# Patient Record
Sex: Female | Born: 1950 | Race: White | Hispanic: No | Marital: Married | Smoking: Former smoker
Health system: Southern US, Community
[De-identification: ages and names within clinical notes are randomized; demographics above are authoritative.]

## PROBLEM LIST (undated history)

## (undated) DIAGNOSIS — M109 Gout, unspecified: Secondary | ICD-10-CM

## (undated) DIAGNOSIS — F32A Depression, unspecified: Secondary | ICD-10-CM

## (undated) DIAGNOSIS — I10 Essential (primary) hypertension: Secondary | ICD-10-CM

## (undated) DIAGNOSIS — F419 Anxiety disorder, unspecified: Secondary | ICD-10-CM

## (undated) DIAGNOSIS — M1990 Unspecified osteoarthritis, unspecified site: Secondary | ICD-10-CM

## (undated) DIAGNOSIS — G4733 Obstructive sleep apnea (adult) (pediatric): Secondary | ICD-10-CM

## (undated) DIAGNOSIS — F329 Major depressive disorder, single episode, unspecified: Secondary | ICD-10-CM

## (undated) DIAGNOSIS — E785 Hyperlipidemia, unspecified: Secondary | ICD-10-CM

## (undated) HISTORY — DX: Essential (primary) hypertension: I10

## (undated) HISTORY — PX: CARPAL TUNNEL RELEASE: SHX101

## (undated) HISTORY — DX: Depression, unspecified: F32.A

## (undated) HISTORY — PX: PLANTAR FASCIA RELEASE: SHX2239

## (undated) HISTORY — PX: ABDOMINAL HYSTERECTOMY: SHX81

## (undated) HISTORY — PX: CHOLECYSTECTOMY: SHX55

## (undated) HISTORY — DX: Unspecified osteoarthritis, unspecified site: M19.90

## (undated) HISTORY — PX: REPLACEMENT TOTAL KNEE: SUR1224

## (undated) HISTORY — DX: Major depressive disorder, single episode, unspecified: F32.9

## (undated) HISTORY — DX: Hyperlipidemia, unspecified: E78.5

## (undated) HISTORY — DX: Obstructive sleep apnea (adult) (pediatric): G47.33

## (undated) HISTORY — DX: Anxiety disorder, unspecified: F41.9

## (undated) HISTORY — PX: GASTRIC BYPASS: SHX52

---

## 1999-04-06 ENCOUNTER — Encounter: Admission: RE | Admit: 1999-04-06 | Discharge: 1999-07-05 | Payer: Self-pay | Admitting: Unknown Physician Specialty

## 1999-05-09 ENCOUNTER — Emergency Department (HOSPITAL_COMMUNITY): Admission: EM | Admit: 1999-05-09 | Discharge: 1999-05-09 | Payer: Self-pay | Admitting: Emergency Medicine

## 1999-05-10 ENCOUNTER — Inpatient Hospital Stay (HOSPITAL_COMMUNITY): Admission: EM | Admit: 1999-05-10 | Discharge: 1999-05-13 | Payer: Self-pay | Admitting: *Deleted

## 1999-05-10 ENCOUNTER — Encounter (INDEPENDENT_AMBULATORY_CARE_PROVIDER_SITE_OTHER): Payer: Self-pay

## 1999-05-10 ENCOUNTER — Encounter: Payer: Self-pay | Admitting: Emergency Medicine

## 2003-04-05 ENCOUNTER — Encounter: Admission: RE | Admit: 2003-04-05 | Discharge: 2003-07-04 | Payer: Self-pay | Admitting: Surgery

## 2003-04-09 ENCOUNTER — Encounter: Admission: RE | Admit: 2003-04-09 | Discharge: 2003-04-09 | Payer: Self-pay | Admitting: Surgery

## 2003-04-10 ENCOUNTER — Encounter: Admission: RE | Admit: 2003-04-10 | Discharge: 2003-07-09 | Payer: Self-pay | Admitting: Surgery

## 2003-04-16 ENCOUNTER — Encounter (INDEPENDENT_AMBULATORY_CARE_PROVIDER_SITE_OTHER): Payer: Self-pay | Admitting: Cardiology

## 2003-04-16 ENCOUNTER — Ambulatory Visit (HOSPITAL_COMMUNITY): Admission: RE | Admit: 2003-04-16 | Discharge: 2003-04-16 | Payer: Self-pay | Admitting: Surgery

## 2003-08-20 ENCOUNTER — Encounter: Admission: RE | Admit: 2003-08-20 | Discharge: 2003-11-18 | Payer: Self-pay | Admitting: Surgery

## 2003-10-28 ENCOUNTER — Encounter (INDEPENDENT_AMBULATORY_CARE_PROVIDER_SITE_OTHER): Payer: Self-pay | Admitting: Specialist

## 2003-10-28 ENCOUNTER — Inpatient Hospital Stay (HOSPITAL_COMMUNITY): Admission: RE | Admit: 2003-10-28 | Discharge: 2003-10-31 | Payer: Self-pay | Admitting: Surgery

## 2004-01-07 ENCOUNTER — Encounter: Admission: RE | Admit: 2004-01-07 | Discharge: 2004-04-06 | Payer: Self-pay | Admitting: Surgery

## 2004-04-14 ENCOUNTER — Encounter: Admission: RE | Admit: 2004-04-14 | Discharge: 2004-04-14 | Payer: Self-pay | Admitting: Surgery

## 2004-07-27 ENCOUNTER — Encounter: Admission: RE | Admit: 2004-07-27 | Discharge: 2004-10-25 | Payer: Self-pay | Admitting: Surgery

## 2005-12-22 ENCOUNTER — Ambulatory Visit: Payer: Self-pay | Admitting: Cardiology

## 2005-12-28 ENCOUNTER — Ambulatory Visit: Payer: Self-pay

## 2005-12-28 ENCOUNTER — Encounter: Payer: Self-pay | Admitting: Cardiology

## 2009-10-27 ENCOUNTER — Inpatient Hospital Stay (HOSPITAL_COMMUNITY): Admission: RE | Admit: 2009-10-27 | Discharge: 2009-10-30 | Payer: Self-pay | Admitting: Orthopedic Surgery

## 2010-02-18 ENCOUNTER — Ambulatory Visit (HOSPITAL_COMMUNITY): Admission: RE | Admit: 2010-02-18 | Discharge: 2010-02-18 | Payer: Self-pay | Admitting: Orthopedic Surgery

## 2010-08-20 LAB — PROTIME-INR: Prothrombin Time: 13 seconds (ref 11.6–15.2)

## 2010-08-20 LAB — COMPREHENSIVE METABOLIC PANEL
ALT: 15 U/L (ref 0–35)
AST: 18 U/L (ref 0–37)
Albumin: 3.6 g/dL (ref 3.5–5.2)
BUN: 15 mg/dL (ref 6–23)
CO2: 27 mEq/L (ref 19–32)
Calcium: 8.6 mg/dL (ref 8.4–10.5)
Glucose, Bld: 67 mg/dL — ABNORMAL LOW (ref 70–99)

## 2010-08-20 LAB — URINALYSIS, ROUTINE W REFLEX MICROSCOPIC
Glucose, UA: NEGATIVE mg/dL
Ketones, ur: NEGATIVE mg/dL
Nitrite: NEGATIVE
Specific Gravity, Urine: 1.03 (ref 1.005–1.030)
pH: 5 (ref 5.0–8.0)

## 2010-08-20 LAB — CBC
HCT: 32.1 % — ABNORMAL LOW (ref 36.0–46.0)
Hemoglobin: 10.8 g/dL — ABNORMAL LOW (ref 12.0–15.0)
MCH: 28.1 pg (ref 26.0–34.0)
MCHC: 33.7 g/dL (ref 30.0–36.0)
MCV: 83.5 fL (ref 78.0–100.0)
Platelets: 299 10*3/uL (ref 150–400)
RBC: 3.84 MIL/uL — ABNORMAL LOW (ref 3.87–5.11)
WBC: 7.6 10*3/uL (ref 4.0–10.5)

## 2010-08-20 LAB — URINE MICROSCOPIC-ADD ON

## 2010-08-20 LAB — SURGICAL PCR SCREEN: MRSA, PCR: NEGATIVE

## 2010-08-24 LAB — COMPREHENSIVE METABOLIC PANEL
AST: 17 U/L (ref 0–37)
Albumin: 3.6 g/dL (ref 3.5–5.2)
Alkaline Phosphatase: 86 U/L (ref 39–117)
Chloride: 105 mEq/L (ref 96–112)
Glucose, Bld: 94 mg/dL (ref 70–99)
Potassium: 4.8 mEq/L (ref 3.5–5.1)
Sodium: 142 mEq/L (ref 135–145)
Total Bilirubin: 0.5 mg/dL (ref 0.3–1.2)

## 2010-08-24 LAB — BASIC METABOLIC PANEL
BUN: 8 mg/dL (ref 6–23)
CO2: 29 mEq/L (ref 19–32)
Calcium: 8.3 mg/dL — ABNORMAL LOW (ref 8.4–10.5)
Calcium: 8.5 mg/dL (ref 8.4–10.5)
Creatinine, Ser: 0.68 mg/dL (ref 0.4–1.2)
GFR calc Af Amer: >60 mL/min (ref >60)
GFR calc Af Amer: >60 mL/min (ref >60)
GFR calc Af Amer: >60 mL/min (ref >60)
GFR calc non Af Amer: >60 mL/min (ref >60)
Glucose, Bld: 135 mg/dL — ABNORMAL HIGH (ref 70–99)
Glucose, Bld: 144 mg/dL — ABNORMAL HIGH (ref 70–99)
Potassium: 4 mEq/L (ref 3.5–5.1)
Sodium: 136 mEq/L (ref 135–145)
Sodium: 137 mEq/L (ref 135–145)
Sodium: 138 mEq/L (ref 135–145)

## 2010-08-24 LAB — CBC
HCT: 37.9 % (ref 36.0–46.0)
Hemoglobin: 10.7 g/dL — ABNORMAL LOW (ref 12.0–15.0)
MCHC: 33.2 g/dL (ref 30.0–36.0)
MCHC: 34 g/dL (ref 30.0–36.0)
MCHC: 34.2 g/dL (ref 30.0–36.0)
MCV: 88 fL (ref 78.0–100.0)
MCV: 88.4 fL (ref 78.0–100.0)
MCV: 88.5 fL (ref 78.0–100.0)
Platelets: 244 10*3/uL (ref 150–400)
Platelets: 263 10*3/uL (ref 150–400)
RBC: 3.59 MIL/uL — ABNORMAL LOW (ref 3.87–5.11)
RDW: 13.3 % (ref 11.5–15.5)
RDW: 13.5 % (ref 11.5–15.5)
WBC: 11.5 10*3/uL — ABNORMAL HIGH (ref 4.0–10.5)
WBC: 9.7 10*3/uL (ref 4.0–10.5)

## 2010-08-24 LAB — PROTIME-INR
INR: 0.97 (ref 0.00–1.49)
INR: 1.79 — ABNORMAL HIGH (ref 0.00–1.49)

## 2010-08-24 LAB — URINALYSIS, ROUTINE W REFLEX MICROSCOPIC
Hgb urine dipstick: NEGATIVE
Ketones, ur: NEGATIVE mg/dL
Protein, ur: NEGATIVE mg/dL
Specific Gravity, Urine: 1.025 (ref 1.005–1.030)
pH: 6 (ref 5.0–8.0)

## 2010-10-23 NOTE — Assessment & Plan Note (Signed)
[facility]                              CARDIOLOGY OFFICE NOTE   Kathleen Saunders, Kathleen Saunders                        MRN:          [id number]  DATE:12/22/2005                            DOB:          06/04/1951    Kathleen Saunders is a 60 year old female with a past medical history of gastric  bypass surgery, who I am asked to evaluate for palpitations.  She has no  prior cardiac history.  She denies any dyspnea on exertion, orthopnea, PND,  pedal edema, palpitations, presyncope, syncope, or exertional chest pain.  Approximately one week ago the patient states that she woke early in the  morning at approximately 4:00 a.m.  She went to the bathroom and upon her  return to bed, she developed the sudden onset of palpitations.  These were  described as her heart racing.  There was no associated shortness of breath,  nausea, vomiting, chest pain, palpitations, or syncope.  It resolved after  approximately 15 minutes.  She has had no symptoms since.  She was seen by  Dr. Charm Barges and we were asked to further evaluate.  There is a question of  atrial fibrillation on electrocardiogram as well.   MEDICATIONS:  None at this time.   ALLERGIES:  SHE HAS NO KNOWN DRUG ALLERGIES.   PAST MEDICAL HISTORY:  There is no diabetes mellitus, hypertension, or  hyperlipidemia by her report.  She does have a history of obesity and is  status post gastric bypass surgery and has lost approximately 135 pounds.  She also has a history of sleep apnea but has not used CPAP since her  surgery.  There is a history of asthma as well as arthritis.   SOCIAL HISTORY:  She does not smoke.  She also does not consume alcohol.   FAMILY HISTORY:  Strongly positive for coronary disease as her father died  of a myocardial infarction at age 29.  Her mother has also had coronary  disease.  She has a sister who had a stroke at an early age.   REVIEW OF SYSTEMS:  There are no headaches, fevers, or chills.   There is no  productive cough or hemoptysis.  There is no dysphagia, odynophagia, melena,  or hematochezia.  There is no dysuria or hematuria.  There is no rash or  seizure activity.  There is no orthopnea, PND, or pedal edema.  Remainder of  systems are negative.   PHYSICAL EXAMINATION:  VITALS:  Today shows a blood pressure of 128/75 and  her pulse is 57.  She weighs 214 pounds.  GENERAL:  She is well-developed and somewhat obese.  She is in no acute  distress.  SKIN:  Warm and dry.  She does not appear to be depressed.  There is no peripheral clubbing.  HEENT:  Unremarkable with no __________.  NECK:  Supple with a normal S1, S2 bilaterally.  There are no bruits noted.  There is no jugular venous distention.  I cannot appreciate thyromegaly.  CHEST:  Clear to auscultation with normal expansion.  CARDIOVASCULAR:  Regular rate and rhythm.  Normal S1 and S2.  There are no  murmurs, rubs, or gallops noted.  ABDOMINAL EXAM:  Nontender.  Positive bowel sounds.  No hepatosplenomegaly.  No masses appreciated.  There is no abdominal bruit.  She is status post  cholecystectomy.  She has 2+ femoral pulses bilaterally.  No bruits.  EXTREMITIES:  Show no edema.  I can palpate no cords.  She has 2+ dorsalis  pedis pulses bilaterally.  NEUROLOGICAL  EXAM:  Grossly intact.  I do have an electrocardiogram from Dr. Silvana Newness office that appears to show  a sinus rhythm with poor R wave progression and PACs.  It is a technically  suboptimal electrocardiogram.  Her electrocardiogram here shows a sinus  rhythm at a rate of 54.  There are no ST changes noted.  Note, there is  sinus arrhythmia.   DIAGNOSES:  1.  Palpitations.  2.  Strong family history of coronary disease.  3.  Status post gastric bypass surgery.   PLAN:  Kathleen Saunders presents for evaluation of episode of palpitations that  sounds potentially like it could be SVT.  This is her first episode and she  has had no subsequent episodes.  We  will therefore follow this expectantly.  If she has recurrent episodes in the future, then we will provide her with  an event monitor for documentation of arrhythmia.  She does have a strong  family history of coronary disease and I would like to review her LV  function.  We will therefore schedule her for a stress echocardiogram for  risk stratification.  Will also check a TSH.  I will have her return to see  me in approximately four months.                              Madolyn Frieze Jens Som, MD, [facility]    BSC/MedQ  DD:  12/22/2005  DT:  12/22/2005  Job #:  [id number]   cc:   Samuel Jester

## 2010-10-23 NOTE — Discharge Summary (Signed)
NAME:  MINDEE, ROBLEDO                           ACCOUNT NO.:  [id number]   MEDICAL RECORD NO.:  [id number]                   PATIENT TYPE:  INP   LOCATION:  0455                                 FACILITY:  [facility]   PHYSICIAN:  Thornton Park. Daphine Deutscher, M.D.             DATE OF BIRTH:  11-17-1950   DATE OF ADMISSION:  10/28/2003  DATE OF DISCHARGE:  10/31/2003                                 DISCHARGE SUMMARY   ADMISSION DIAGNOSIS:  Morbid obesity.   DISCHARGE DIAGNOSIS:  Morbid obesity.   PROCEDURE:  Laparoscopic Roux-en-Y gastric bypass.   COURSE IN THE HOSPITAL:  Riyanshi Wahab is a 60 year old lady admitted in the  a.m. of May 23rd and underwent a laparoscopic Roux-en-Y gastric bypass.  Postop day #1 she has a Doppler study of her lower extremity which did not  show any evidence of DVT.  She went down and had a swallow which showed a  good pouch and prompt emptying without evidence of a leak.  She was advanced  on a liquid diet.  She did have some numbness in her right hand primarily  the first fingers and thumb but it seemed like it was getting better.  She  was tolerating the diet well on Oct 31, 2003 and was ready for discharge.  She was given Roxicet elixir to take as needed for pain and she will be  followed up in the office within the week for drain removal.  She was  instructed on the management of her drain which she felt comfortable with.   CONDITION:  Good.  Return in 1 week.                                               Thornton Park Daphine Deutscher, M.D.    MBM/MEDQ  D:  10/31/2003  T:  10/31/2003  Job:  [id number]

## 2010-10-23 NOTE — Op Note (Signed)
NAME:  Kathleen Saunders, Kathleen Saunders                           ACCOUNT NO.:  [id number]   MEDICAL RECORD NO.:  [id number]                   PATIENT TYPE:  INP   LOCATION:  X001                                 FACILITY:  [facility]   PHYSICIAN:  Thornton Park. Daphine Deutscher, M.D.             DATE OF BIRTH:  [birth year]   DATE OF PROCEDURE:  10/28/2003  DATE OF DISCHARGE:                                 OPERATIVE REPORT   PREOPERATIVE DIAGNOSES:  Morbid obesity, BMI 53.   POSTOPERATIVE DIAGNOSES:  Morbid obesity, BMI 53.   PROCEDURE:  Laparoscopic roux-en-Y gastric bypass (100 cm roux limb, 44 cm  biliopancreatic limb).   SURGEON:  Thornton Park. Daphine Deutscher, M.D.   ASSISTANT:  Sandria Bales. Ezzard Standing, M.D.   ANESTHESIA:  General endotracheal.   OPERATIVE TIME:  3 hours and 30 minutes.   DRAINS:  One JP drain in the left upper quadrant.   DESCRIPTION OF PROCEDURE:  Kathleen Saunders is  a 60 year old lady taken to  room one. Preoperative extensive informed consent was obtained regarding  laparoscopic as well as open gastric bypass. Following introduction of  anesthesia, the abdomen was prepped widely with Betadine and draped  sterilely. Using the zero degree scope in the Optiview technique, we were  able to gain access to the left upper quadrant without difficulty and  insufflate the abdomen. I then placed a 5 mm lateral to that and with the  harmonic scalpel took down mini adhesions in the midline which were mainly  omentum stuck up to the anterior abdominal wall. This was necessary to  enable [place] to move forward with the operation and I did this completely. I  also took down adhesions in her previous Kocher subcostal incision where she  had an open gallbladder. Following this, I placed the remainder of my  trocars including two slightly to the right of midline, one below the  umbilicus and with these we first identified the ligament of Treitz and I  marked about 44 cm which is where I found maximal freedom of this loop  going  up toward the liver.  I divided it with two applications of the endoGIA and  then used the harmonic to make a little bit deep cut into the mesentery. I  then sutured a tail of Penrose drain on the distal tip and then measured  down 100 cm.   At that point, I sutured the end of the biliopancreatic limb to the side of  the roux limb, tied these together then.  I made a common opening, inserted  the stapler and fired the stapler.  There was a little area where the suture  had pulled through and I subsequently reinforced that with a simple suture  of 3-0 silk. In the meantime the common defect was closed from either end  using 2-0 Vicryl starting in the staple line brought out to each end and  then suturing toward  each other where I then tied across from each other and  closed this completely. This was subsequently sealed with Tisseel.   The mesenteric defect was then closed using a laparotie on one end of the 2-  0 silk using the endostitch and suturing the mesenteric defect closed with  the device completely.   Next, the [facility] retractor was inserted, the liver was retracted. We went  up to the top and I went up and took down the cardia and found that it was  really stuck to the spleen. I was however, able to create a small window  there. I then measured down 4 cm on the lesser curvature and we created a  window there along the lesser curvature and went back behind the stomach to  create that space.  Through that window I then introduced the stapler and  fired that.  We then used the Ewald tube to help calibrate the inlet and  then used multiple applications of the GIA heading straight up trying to  make a long skin tube about 4 cm long. We were able to get into the empty  space and were able to create the pouch with several applications ending up  at the previous dissection site.   The roux limb was then brought up and it seemed to come up easily antecolic,  antegastric. It  was sutured to the end of the staple line with a running 2-0  Vicryl tying it at the end and then suturing along this bringing the roux  limb alongside the staple line of the small gastric pouch. When this was  completed, I put a laparotie on the end.  Openings were made in both the  stomach and the small intestine and through this the endoGIA was passed and  subsequently fired. The staple line was inspected and looked good. The  common defect was closed with two sutures again using the 2-0 Vicryl. The  closed this completely. A second layer was completely anteriorly using a  running free suture of 2-0 Vicryl suturing this and imbricating it over  between the small intestine and the stomach. This was done over the Ewald  tube which had been passed through the anastomosis.   This was then passed across the anastomosis and removed and then I clamped  the bowel distally and Dr. Ezzard Standing endoscoped the patient which revealed a  nice small pouch, no evidence of bleeding, a wide open anastomosis.  No  bubbles were seen from the emersion of the pouch.  I then went through the  irrigant. Tisseel was applied on the gastrojejunostomy.  It had previously  been also applied along the staple margin of the proximal pouch on both the  remnant and on the pouch.  A JP drain was placed beneath the liver and the  Nathanson retractor was removed. This drain was sutured in place with 3-0  nylon.  The ports were withdrawn, abdomen deflated. Prior to doing this, we  surveyed the abdomen and no other problems or issues were present.  The  wounds were then closed with 4-0 Vicryl subcuticularly. The patient  tolerated the procedure well. She was taken to the recovery room in  satisfactory condition.                                               Thornton Park Daphine Deutscher,  M.D.    MBM/MEDQ  D:  10/28/2003  T:  10/28/2003  Job:  [id number]   cc:   [address]  [place]  [place] [address]  Fax: [phone]   Carlisle Beers. Rendall III, M.D.  [address] [place]  [place] [address]  Fax: [phone]

## 2010-10-23 NOTE — Op Note (Signed)
NAME:  ADEN, YOUNGMAN                           ACCOUNT NO.:  [id number]   MEDICAL RECORD NO.:  [id number]                   PATIENT TYPE:  INP   LOCATION:  0455                                 FACILITY:  [facility]   PHYSICIAN:  Sandria Bales. Ezzard Standing, M.D.               DATE OF BIRTH:  Dec 16, 1950   DATE OF PROCEDURE:  10/28/2003  DATE OF DISCHARGE:                                 OPERATIVE REPORT   PREOPERATIVE DIAGNOSIS:  Morbid obesity with body mass index of 53, status  post Roux-Y gastrojejunostomy.   POSTOPERATIVE DIAGNOSIS:  Morbid obesity with body mass index of 53, status  post Roux-Y gastrojejunostomy.   PROCEDURE:  Esophagogastroduodenoscopy.   SURGEON:  Dr. Ezzard Standing, MD   ANESTHESIA:  General.   INDICATION FOR PROCEDURE:  Ms. Shoults is a morbidly obese female with  approximately 53 BMI, who has undergone a laparoscopic Roux-en-Y  gastrojejunostomy by Dr. Wenda Low.  I am now doing an endoscopy to  document patency of the anastomosis and locations of the anastomosis.   DESCRIPTION OF PROCEDURE:  The patient is under general anesthesia.  Laparoscoping the patient with laparoscopy, now to do an upper endoscopy.   The flexible Olympus endoscope was passed without difficulty in the  esophagus, into the stomach pouch.  The gastrojejunal anastomosis was widely  patent, and I took pictures of this.  The gastrojejunal anastomosis was at  47 cm.  Her esophagogastric junction was at about 41-42 cm for a pouch of  about 5-6 cm in length.  I then insufflated the pouch while Dr. Daphine Deutscher held  a clamp on the jejunum, and he flooded the upper abdomen with saline.  There  was no evidence of air leak.  There was no evidence of bleeding from the  pouch.   The esophagus was considered normal.  The patient tolerated the procedure  well.  Dr. Daphine Deutscher will dictate the remainder of the Roux-Y  gastrojejunostomy.                                               Sandria Bales. Ezzard Standing, M.D.    DHN/MEDQ  D:   10/29/2003  T:  10/29/2003  Job:  [id number]

## 2015-06-17 DIAGNOSIS — M5136 Other intervertebral disc degeneration, lumbar region: Secondary | ICD-10-CM | POA: Diagnosis not present

## 2015-06-17 DIAGNOSIS — M179 Osteoarthritis of knee, unspecified: Secondary | ICD-10-CM | POA: Diagnosis not present

## 2015-06-17 DIAGNOSIS — I10 Essential (primary) hypertension: Secondary | ICD-10-CM | POA: Diagnosis not present

## 2015-06-17 DIAGNOSIS — G4733 Obstructive sleep apnea (adult) (pediatric): Secondary | ICD-10-CM | POA: Diagnosis not present

## 2016-06-02 ENCOUNTER — Ambulatory Visit (INDEPENDENT_AMBULATORY_CARE_PROVIDER_SITE_OTHER): Payer: PPO | Admitting: Family

## 2016-06-02 ENCOUNTER — Encounter (INDEPENDENT_AMBULATORY_CARE_PROVIDER_SITE_OTHER): Payer: Self-pay

## 2016-06-02 ENCOUNTER — Encounter: Payer: Self-pay | Admitting: Family

## 2016-06-02 VITALS — BP 110/66 | HR 65 | Temp 97.1°F | Ht 63.0 in | Wt 265.8 lb

## 2016-06-02 DIAGNOSIS — Z6841 Body Mass Index (BMI) 40.0 and over, adult: Secondary | ICD-10-CM

## 2016-06-02 DIAGNOSIS — I10 Essential (primary) hypertension: Secondary | ICD-10-CM

## 2016-06-02 DIAGNOSIS — Z1211 Encounter for screening for malignant neoplasm of colon: Secondary | ICD-10-CM | POA: Diagnosis not present

## 2016-06-02 DIAGNOSIS — F329 Major depressive disorder, single episode, unspecified: Secondary | ICD-10-CM | POA: Insufficient documentation

## 2016-06-02 DIAGNOSIS — M159 Polyosteoarthritis, unspecified: Secondary | ICD-10-CM

## 2016-06-02 DIAGNOSIS — F331 Major depressive disorder, recurrent, moderate: Secondary | ICD-10-CM | POA: Diagnosis not present

## 2016-06-02 DIAGNOSIS — Z1239 Encounter for other screening for malignant neoplasm of breast: Secondary | ICD-10-CM

## 2016-06-02 DIAGNOSIS — M19012 Primary osteoarthritis, left shoulder: Secondary | ICD-10-CM | POA: Diagnosis not present

## 2016-06-02 DIAGNOSIS — F32A Depression, unspecified: Secondary | ICD-10-CM | POA: Insufficient documentation

## 2016-06-02 DIAGNOSIS — F411 Generalized anxiety disorder: Secondary | ICD-10-CM | POA: Diagnosis not present

## 2016-06-02 DIAGNOSIS — Z1159 Encounter for screening for other viral diseases: Secondary | ICD-10-CM

## 2016-06-02 DIAGNOSIS — M19019 Primary osteoarthritis, unspecified shoulder: Secondary | ICD-10-CM

## 2016-06-02 DIAGNOSIS — M150 Primary generalized (osteo)arthritis: Secondary | ICD-10-CM | POA: Diagnosis not present

## 2016-06-02 DIAGNOSIS — J45909 Unspecified asthma, uncomplicated: Secondary | ICD-10-CM | POA: Insufficient documentation

## 2016-06-02 DIAGNOSIS — E785 Hyperlipidemia, unspecified: Secondary | ICD-10-CM

## 2016-06-02 DIAGNOSIS — Z114 Encounter for screening for human immunodeficiency virus [HIV]: Secondary | ICD-10-CM

## 2016-06-02 DIAGNOSIS — Z1231 Encounter for screening mammogram for malignant neoplasm of breast: Secondary | ICD-10-CM | POA: Diagnosis not present

## 2016-06-02 DIAGNOSIS — M1990 Unspecified osteoarthritis, unspecified site: Secondary | ICD-10-CM | POA: Insufficient documentation

## 2016-06-02 DIAGNOSIS — J4520 Mild intermittent asthma, uncomplicated: Secondary | ICD-10-CM | POA: Diagnosis not present

## 2016-06-02 MED ORDER — LISINOPRIL-HYDROCHLOROTHIAZIDE 20-25 MG PO TABS: 1.0000 | ORAL_TABLET | Freq: Every day | ORAL | 3 refills | Status: DC

## 2016-06-02 MED ORDER — BUPIVACAINE HCL 0.25 % IJ SOLN
1.0000 mL | Freq: Once | INTRAMUSCULAR | Status: AC
  Administered 2016-06-02: 1 mL via INTRA_ARTICULAR

## 2016-06-02 MED ORDER — MELOXICAM 15 MG PO TABS: ORAL_TABLET | ORAL | 1 refills | Status: DC

## 2016-06-02 MED ORDER — SERTRALINE HCL 50 MG PO TABS: 50.0000 mg | ORAL_TABLET | Freq: Every day | ORAL | 3 refills | Status: DC

## 2016-06-02 MED ORDER — METHOCARBAMOL 500 MG PO TABS: 500.0000 mg | ORAL_TABLET | Freq: Four times a day (QID) | ORAL | 3 refills | Status: DC | PRN

## 2016-06-02 MED ORDER — METHYLPREDNISOLONE ACETATE 40 MG/ML IJ SUSP
40.0000 mg | Freq: Once | INTRAMUSCULAR | Status: AC
  Administered 2016-06-02: 40 mg via INTRA_ARTICULAR

## 2016-06-02 MED ORDER — ALPRAZOLAM 0.5 MG PO TABS: 0.5000 mg | ORAL_TABLET | Freq: Every evening | ORAL | 3 refills | Status: DC | PRN

## 2016-06-02 NOTE — Progress Notes (Signed)
Subjective:    Patient ID: Kathleen Saunders, female    DOB: 02-Dec-1950, 65 y.o.   MRN: [id number]  Pt presents to the office today to establish care and chronic follow up.  Hypertension  This is a chronic problem. The current episode started more than 1 year ago. The problem has been resolved since onset. The problem is controlled. Associated symptoms include anxiety and shortness of breath (at times). Pertinent negatives include no headaches, malaise/fatigue, palpitations or peripheral edema. Risk factors for coronary artery disease include family history, obesity and sedentary lifestyle. Past treatments include ACE inhibitors. The current treatment provides moderate improvement. There is no history of kidney disease, CAD/MI, CVA, heart failure or a thyroid problem.  Anxiety  Presents for follow-up visit. Symptoms include shortness of breath (at times). Patient reports no depressed mood, dry mouth, excessive worry, nausea, nervous/anxious behavior or palpitations. The quality of sleep is good.   Her past medical history is significant for asthma.  Depression         This is a chronic problem.  The current episode started more than 1 year ago.   The onset quality is gradual.   The problem has been resolved since onset.  Associated symptoms include no helplessness, no hopelessness, not irritable, no myalgias, no headaches and not sad.     The symptoms are aggravated by family issues.  Past treatments include SSRIs - Selective serotonin reuptake inhibitors.  Previous treatment provided moderate relief.  Past medical history includes anxiety.     Pertinent negatives include no thyroid problem. Arthritis  Presents for follow-up visit. She complains of pain and joint warmth. The symptoms have been worsening. Affected locations include the right hip, left hip, right knee and left knee. Her pain is at a severity of 8/10. Associated symptoms include pain while resting. Pertinent negatives include no dry mouth.  Her past medical history is significant for osteoarthritis. Side effects of treatment include joint pain.  Asthma  She complains of shortness of breath (at times). There is no difficulty breathing or wheezing. This is a chronic problem. The current episode started more than 1 year ago. The problem occurs rarely. The problem has been waxing and waning. Pertinent negatives include no headaches, malaise/fatigue, myalgias or nasal congestion. Her symptoms are aggravated by exercise. Her symptoms are alleviated by ipratropium. She reports minimal improvement on treatment. Her past medical history is significant for asthma.  Hyperlipidemia  This is a chronic problem. The current episode started more than 1 year ago. The problem is uncontrolled. Recent lipid tests were reviewed and are high. Exacerbating diseases include obesity. Associated symptoms include shortness of breath (at times). Pertinent negatives include no myalgias. Current antihyperlipidemic treatment includes diet change. The current treatment provides moderate improvement of lipids. Risk factors for coronary artery disease include dyslipidemia, family history, hypertension, obesity, a sedentary lifestyle and post-menopausal.  Shoulder Pain   The pain is present in the left shoulder. This is a chronic problem. The current episode started 1 to 4 weeks ago. There has been no history of extremity trauma. The problem occurs constantly. The problem has been gradually worsening. The quality of the pain is described as aching. The pain is at a severity of 10/10. The pain is moderate. Associated symptoms include an inability to bear weight and a limited range of motion. She has tried acetaminophen and rest for the symptoms. The treatment provided mild relief. Her past medical history is significant for osteoarthritis.      Review of  Systems  Constitutional: Negative for malaise/fatigue.  Respiratory: Positive for shortness of breath (at times). Negative  for wheezing.   Cardiovascular: Negative for palpitations.  Gastrointestinal: Negative for nausea.  Musculoskeletal: Positive for arthritis. Negative for myalgias.  Neurological: Negative for headaches.  Psychiatric/Behavioral: Positive for depression. The patient is not nervous/anxious.   All other systems reviewed and are negative.  Family History  Problem Relation Age of Onset  . Diabetes Mother   . Heart disease Father    Social History   Social History  . Marital status: Married    Spouse name: N/A  . Number of children: N/A  . Years of education: N/A   Social History Main Topics  . Smoking status: Former Research scientist (life sciences)  . Smokeless tobacco: Never Used  . Alcohol use No  . Drug use: No  . Sexual activity: Not Asked   Other Topics Concern  . None   Social History Narrative  . None          Objective:   Physical Exam  Constitutional: She is oriented to person, place, and time. She appears well-developed and well-nourished. She is not irritable. No distress.  Morbid obese   HENT:  Head: Normocephalic and atraumatic.  Right Ear: External ear normal.  Left Ear: External ear normal.  Nose: Nose normal.  Mouth/Throat: Oropharynx is clear and moist.  Eyes: Pupils are equal, round, and reactive to light.  Neck: Normal range of motion. Neck supple. No thyromegaly present.  Cardiovascular: Normal rate, regular rhythm, normal heart sounds and intact distal pulses.   No murmur heard. Pulmonary/Chest: Effort normal and breath sounds normal. No respiratory distress. She has no wheezes.  Abdominal: Soft. Bowel sounds are normal. She exhibits no distension. There is no tenderness.  Musculoskeletal: She exhibits no edema or tenderness.  Limited ROM of left shoulder with abduction   Neurological: She is alert and oriented to person, place, and time.  Skin: Skin is warm and dry.  Psychiatric: She has a normal mood and affect. Her behavior is normal. Judgment and thought content  normal.  Vitals reviewed.   leftshoulder prepped with betadine Injected with Marcaine .5% plain and methylprednisolone with 22 guage needle x 1. Patient tolerated well.   BP 110/66   Pulse 65   Temp 97.1 F (36.2 C) (Oral)   Ht _0  (1.6 m)   Wt 265 lb 12.8 oz (120.6 kg)   BMI 47.08 kg/m      Assessment & Plan:  1. Essential hypertension - CMP14+EGFR - Lipid panel - lisinopril-hydrochlorothiazide (PRINZIDE,ZESTORETIC) 20-25 MG tablet; Take 1 tablet by mouth daily.  Dispense: 90 tablet; Refill: 3  2. GAD (generalized anxiety disorder) - CMP14+EGFR - ALPRAZolam (XANAX) 0.5 MG tablet; Take 1 tablet (0.5 mg total) by mouth at bedtime as needed for anxiety.  Dispense: 30 tablet; Refill: 3 - sertraline (ZOLOFT) 50 MG tablet; Take 1 tablet (50 mg total) by mouth daily.  Dispense: 90 tablet; Refill: 3  3. Moderate episode of recurrent major depressive disorder (HCC) - CMP14+EGFR - ALPRAZolam (XANAX) 0.5 MG tablet; Take 1 tablet (0.5 mg total) by mouth at bedtime as needed for anxiety.  Dispense: 30 tablet; Refill: 3 - sertraline (ZOLOFT) 50 MG tablet; Take 1 tablet (50 mg total) by mouth daily.  Dispense: 90 tablet; Refill: 3  4. Primary osteoarthritis involving multiple joints - CMP14+EGFR - meloxicam (MOBIC) 15 MG tablet; TK 1 T PO QD  Dispense: 90 tablet; Refill: 1 - methocarbamol (ROBAXIN) 500 MG tablet; Take 1  tablet (500 mg total) by mouth every 6 (six) hours as needed for muscle spasms.  Dispense: 90 tablet; Refill: 3  5. Mild intermittent asthma without complication - SCB83+JRPZ  6. Morbid obesity with BMI of 45.0-49.9, adult (HCC) - CMP14+EGFR  7. Need for hepatitis C screening test - CMP14+EGFR - Hepatitis C antibody  8. Encounter for screening for HIV - CMP14+EGFR - HIV antibody  9. Hyperlipidemia, unspecified hyperlipidemia type - CMP14+EGFR - Lipid panel  10. Colon cancer screening - CMP14+EGFR - Ambulatory referral to Gastroenterology  11. Breast  cancer screening - CMP14+EGFR - MM Digital Screening; Future  12. Arthritis of shoulder - bupivacaine (MARCAINE) 0.25 % (with pres) injection 1 mL; Inject 1 mL into the articular space once. - methylPREDNISolone acetate (DEPO-MEDROL) injection 40 mg; Inject 1 mL (40 mg total) into the articular space once.  Continue all meds Labs pending Health Maintenance reviewed Diet and exercise encouraged RTO 3 months for pain contract and follow up  Evelina Dun, FNP

## 2016-06-02 NOTE — Patient Instructions (Signed)

## 2016-06-03 ENCOUNTER — Other Ambulatory Visit: Payer: Self-pay | Admitting: Family

## 2016-06-03 DIAGNOSIS — E785 Hyperlipidemia, unspecified: Secondary | ICD-10-CM | POA: Insufficient documentation

## 2016-06-03 LAB — CMP14+EGFR
ALBUMIN: 3.9 g/dL (ref 3.6–4.8)
ALK PHOS: 106 IU/L (ref 39–117)
ALT: 11 IU/L (ref 0–32)
AST: 12 IU/L (ref 0–40)
Albumin/Globulin Ratio: 1.6 (ref 1.2–2.2)
BUN / CREAT RATIO: 24 (ref 12–28)
BUN: 29 mg/dL — ABNORMAL HIGH (ref 8–27)
Bilirubin Total: 0.2 mg/dL (ref 0.0–1.2)
CO2: 23 mmol/L (ref 18–29)
CREATININE: 1.22 mg/dL — AB (ref 0.57–1.00)
Calcium: 9.1 mg/dL (ref 8.7–10.3)
Chloride: 104 mmol/L (ref 96–106)
GFR calc Af Amer: 54 mL/min/{1.73_m2} — ABNORMAL LOW (ref >59)
GFR calc non Af Amer: 47 mL/min/{1.73_m2} — ABNORMAL LOW (ref >59)
GLOBULIN, TOTAL: 2.5 g/dL (ref 1.5–4.5)
Glucose: 84 mg/dL (ref 65–99)
Potassium: 4.8 mmol/L (ref 3.5–5.2)
SODIUM: 143 mmol/L (ref 134–144)
Total Protein: 6.4 g/dL (ref 6.0–8.5)

## 2016-06-03 LAB — LIPID PANEL
CHOL/HDL RATIO: 5.2 ratio — AB (ref 0.0–4.4)
Cholesterol, Total: 243 mg/dL — ABNORMAL HIGH (ref 100–199)
HDL: 47 mg/dL (ref >39)
LDL CALC: 154 mg/dL — AB (ref 0–99)
Triglycerides: 212 mg/dL — ABNORMAL HIGH (ref 0–149)
VLDL Cholesterol Cal: 42 mg/dL — ABNORMAL HIGH (ref 5–40)

## 2016-06-03 LAB — HEPATITIS C ANTIBODY

## 2016-06-03 LAB — HIV ANTIBODY (ROUTINE TESTING W REFLEX): HIV SCREEN 4TH GENERATION: NONREACTIVE

## 2016-06-03 MED ORDER — ATORVASTATIN CALCIUM 20 MG PO TABS
20.0000 mg | ORAL_TABLET | Freq: Every day | ORAL | 3 refills | Status: DC
Start: 2016-06-03 — End: 2016-12-01

## 2016-06-25 ENCOUNTER — Ambulatory Visit
Admission: RE | Admit: 2016-06-25 | Discharge: 2016-06-25 | Disposition: A | Discharge status: Home / Self Care | Payer: PPO | Source: Ambulatory Visit | Attending: Family | Admitting: Family

## 2016-06-25 DIAGNOSIS — Z1231 Encounter for screening mammogram for malignant neoplasm of breast: Secondary | ICD-10-CM | POA: Diagnosis not present

## 2016-06-25 DIAGNOSIS — Z1239 Encounter for other screening for malignant neoplasm of breast: Secondary | ICD-10-CM

## 2016-06-28 ENCOUNTER — Other Ambulatory Visit: Payer: Self-pay | Admitting: Family

## 2016-06-28 DIAGNOSIS — R928 Other abnormal and inconclusive findings on diagnostic imaging of breast: Secondary | ICD-10-CM

## 2016-07-05 ENCOUNTER — Other Ambulatory Visit: Payer: PPO

## 2016-07-05 ENCOUNTER — Ambulatory Visit
Admission: RE | Admit: 2016-07-05 | Discharge: 2016-07-05 | Disposition: A | Discharge status: Home / Self Care | Payer: PPO | Source: Ambulatory Visit | Attending: Family | Admitting: Family

## 2016-07-05 DIAGNOSIS — R928 Other abnormal and inconclusive findings on diagnostic imaging of breast: Secondary | ICD-10-CM

## 2016-07-05 DIAGNOSIS — N6321 Unspecified lump in the left breast, upper outer quadrant: Secondary | ICD-10-CM | POA: Diagnosis not present

## 2016-07-07 ENCOUNTER — Telehealth: Payer: Self-pay | Admitting: Family Medicine

## 2016-07-07 MED ORDER — OSELTAMIVIR PHOSPHATE 75 MG PO CAPS: 75.0000 mg | ORAL_CAPSULE | Freq: Every day | ORAL | 0 refills | Status: DC

## 2016-07-07 NOTE — Telephone Encounter (Signed)
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   PPx dose sent. CrCl 89  Murtis SinkSam Hikaru Delorenzo, MD [facility] 07/07/2016, 8:00 AM

## 2016-07-08 ENCOUNTER — Telehealth: Payer: Self-pay | Admitting: Physician Assistant

## 2016-07-08 MED ORDER — AMOXICILLIN 500 MG PO CAPS
1000.0000 mg | ORAL_CAPSULE | Freq: Two times a day (BID) | ORAL | 1 refills | Status: DC
Start: 2016-07-08 — End: 2016-08-26

## 2016-07-08 NOTE — Telephone Encounter (Signed)
Prescription sent to pharmacy.

## 2016-08-24 ENCOUNTER — Ambulatory Visit: Payer: PPO | Admitting: Family

## 2016-08-26 ENCOUNTER — Encounter: Payer: Self-pay | Admitting: Family

## 2016-08-26 ENCOUNTER — Ambulatory Visit (INDEPENDENT_AMBULATORY_CARE_PROVIDER_SITE_OTHER): Payer: PPO | Admitting: Family

## 2016-08-26 VITALS — BP 124/66 | HR 70 | Temp 96.9°F | Ht 63.0 in | Wt 267.0 lb

## 2016-08-26 DIAGNOSIS — M150 Primary generalized (osteo)arthritis: Secondary | ICD-10-CM

## 2016-08-26 DIAGNOSIS — G4733 Obstructive sleep apnea (adult) (pediatric): Secondary | ICD-10-CM | POA: Insufficient documentation

## 2016-08-26 DIAGNOSIS — F1120 Opioid dependence, uncomplicated: Secondary | ICD-10-CM | POA: Diagnosis not present

## 2016-08-26 DIAGNOSIS — Z9989 Dependence on other enabling machines and devices: Secondary | ICD-10-CM

## 2016-08-26 DIAGNOSIS — M159 Polyosteoarthritis, unspecified: Secondary | ICD-10-CM

## 2016-08-26 DIAGNOSIS — Z6841 Body Mass Index (BMI) 40.0 and over, adult: Secondary | ICD-10-CM | POA: Diagnosis not present

## 2016-08-26 DIAGNOSIS — Z0289 Encounter for other administrative examinations: Secondary | ICD-10-CM | POA: Diagnosis not present

## 2016-08-26 MED ORDER — HYDROCODONE-ACETAMINOPHEN 10-325 MG PO TABS: 1.0000 | ORAL_TABLET | Freq: Four times a day (QID) | ORAL | 0 refills | Status: DC | PRN

## 2016-08-26 MED ORDER — HYDROCODONE-ACETAMINOPHEN 10-325 MG PO TABS: 1.0000 | ORAL_TABLET | Freq: Three times a day (TID) | ORAL | 0 refills | Status: DC | PRN

## 2016-08-26 NOTE — Patient Instructions (Signed)
Chronic Back Pain When back pain lasts longer than 3 months, it is called chronic back pain.The cause of your back pain may not be known. Some common causes include:  Wear and tear (degenerative disease) of the bones, ligaments, or disks in your back.  Inflammation and stiffness in your back (arthritis). People who have chronic back pain often go through certain periods in which the pain is more intense (flare-ups). Many people can learn to manage the pain with home care. Follow these instructions at home: Pay attention to any changes in your symptoms. Take these actions to help with your pain: Activity   Avoid bending and activities that make the problem worse.  Do not sit or stand in one place for long periods of time.  Take brief periods of rest throughout the day. This will reduce your pain. Resting in a lying or standing position is usually better than sitting to rest.  When you are resting for longer periods, mix in some mild activity or stretching between periods of rest. This will help to prevent stiffness and pain.  Get regular exercise. Ask your health care provider what activities are safe for you.  Do not lift anything that is heavier than 10 lb (4.5 kg). Always use proper lifting technique, which includes:  Bending your knees.  Keeping the load close to your body.  Avoiding twisting. Managing pain   If directed, apply ice to the painful area. Your health care provider may recommend applying ice during the first 24-48 hours after a flare-up begins.  Put ice in a plastic bag.  Place a towel between your skin and the bag.  Leave the ice on for 20 minutes, 2-3 times per day.  After icing, apply heat to the affected area as often as told by your health care provider. Use the heat source that your health care provider recommends, such as a moist heat pack or a heating pad.  Place a towel between your skin and the heat source.  Leave the heat on for 20-30  minutes.  Remove the heat if your skin turns bright red. This is especially important if you are unable to feel pain, heat, or cold. You may have a greater risk of getting burned.  Try soaking in a warm tub.  Take over-the-counter and prescription medicines only as told by your health care provider.  Keep all follow-up visits as told by your health care provider. This is important. Contact a health care provider if:  You have pain that is not relieved with rest or medicine. Get help right away if:  You have weakness or numbness in one or both of your legs or feet.  You have trouble controlling your bladder or your bowels.  You have nausea or vomiting.  You have pain in your abdomen.  You have shortness of breath or you faint. This information is not intended to replace advice given to you by your health care provider. Make sure you discuss any questions you have with your health care provider. Document Released: 07/01/2004 Document Revised: 10/02/2015 Document Reviewed: 11/11/2014 Elsevier Interactive Patient Education  2017 [employer]vier Inc.  

## 2016-08-26 NOTE — Progress Notes (Signed)
[place] Controlled Substance Abuse database reviewed- Yes If yes- were their any concerning findings : Pt is switching PCP's, pt only received pain medication from that PCP [facility] 08/26/2016 06/02/2016  Decreased Interest 0 0  Down, Depressed, Hopeless 0 0  PHQ - 2 Score 0 0    No flowsheet data found.     [place] drug screen performed- Yes  SOAPP  0= never  1= seldom  2=sometimes  3= often  4= very often  How often do you have mood swings? 0 How often do you smoke a cigarette within an hour after waling up? 0 How often have you taken medication other than the way that it was prescribed?2 How often have you used illegal drugs in the past 5 years? 0 How often, in your lifetime, have you had legal problems or been arrested? 0  Score 2  Alcohol Audit - How often during the last year have found that you: 0-Never   1- Less than monthly   2- Monthly     3-Weekly     4-daily or almost daily  - found that you were not able to stop drinking once you started- 0 -failed to do what was normally expected of you because of drinking- 0 -needed a first drink in the morning- 0 -had a feeling of guilt or remorse after drinking- 0 -are/were unable to remember what happened the night before because of your drinking- 0  0- NO   2- yes but not in last year  4- yes during last year -Have you or someone else been injured because of your drinking- 0 - Has anyone been concerned about your drinking or suggested you cut down- 0        TOTAL- 0  ( 0-7- alcohol education, 8-15- simple advice, 16-19 simple advice plus counseling, 20-40 referral for evaluation and treatment 0   Designated Pharmacy- [facility], [place], [place]  Pain assessment: Cause of pain- Chronic back pain  Pain location- Lower back  Pain on scale of 1-10- 9-10 Frequency- intermittent What increases pain-Doing housework What makes pain Better-Rest and pain medication Effects on ADL -  Stable  Current treatments- Norco 10-325 mg every 6 hours, #120  Pain management agreement reviewed and signed- Yes

## 2016-08-30 LAB — TOXASSURE SELECT 13 (MW), URINE

## 2016-10-22 ENCOUNTER — Encounter: Payer: Self-pay | Admitting: *Deleted

## 2016-11-02 ENCOUNTER — Other Ambulatory Visit: Payer: Self-pay | Admitting: Family

## 2016-11-02 DIAGNOSIS — F1120 Opioid dependence, uncomplicated: Secondary | ICD-10-CM

## 2016-11-02 DIAGNOSIS — Z0289 Encounter for other administrative examinations: Secondary | ICD-10-CM

## 2016-11-02 DIAGNOSIS — M159 Polyosteoarthritis, unspecified: Secondary | ICD-10-CM

## 2016-11-02 DIAGNOSIS — M150 Primary generalized (osteo)arthritis: Principal | ICD-10-CM

## 2016-11-02 DIAGNOSIS — Z6841 Body Mass Index (BMI) 40.0 and over, adult: Secondary | ICD-10-CM

## 2016-11-02 MED ORDER — HYDROCODONE-ACETAMINOPHEN 10-325 MG PO TABS: 1.0000 | ORAL_TABLET | Freq: Four times a day (QID) | ORAL | 0 refills | Status: DC | PRN

## 2016-11-02 NOTE — Progress Notes (Signed)
Norco Prescription ready for pick up

## 2016-11-05 ENCOUNTER — Telehealth: Payer: Self-pay

## 2016-11-05 DIAGNOSIS — M150 Primary generalized (osteo)arthritis: Principal | ICD-10-CM

## 2016-11-05 DIAGNOSIS — M159 Polyosteoarthritis, unspecified: Secondary | ICD-10-CM

## 2016-11-05 MED ORDER — METHOCARBAMOL 500 MG PO TABS: 500.0000 mg | ORAL_TABLET | Freq: Four times a day (QID) | ORAL | 3 refills | Status: DC | PRN

## 2016-11-05 NOTE — Telephone Encounter (Signed)
Yes okay to go ahead and refill

## 2016-11-05 NOTE — Telephone Encounter (Signed)
Prescription sent in  

## 2016-11-05 NOTE — Telephone Encounter (Signed)
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  Angel's mom, please advise.

## 2016-11-29 ENCOUNTER — Ambulatory Visit: Payer: PPO | Admitting: Family

## 2016-11-30 ENCOUNTER — Encounter: Payer: Self-pay | Admitting: Family

## 2016-11-30 ENCOUNTER — Ambulatory Visit (INDEPENDENT_AMBULATORY_CARE_PROVIDER_SITE_OTHER): Payer: PPO | Admitting: Family

## 2016-11-30 VITALS — BP 141/71 | HR 52 | Temp 97.2°F | Ht 63.0 in | Wt 269.0 lb

## 2016-11-30 DIAGNOSIS — Z6841 Body Mass Index (BMI) 40.0 and over, adult: Secondary | ICD-10-CM

## 2016-11-30 DIAGNOSIS — Z0289 Encounter for other administrative examinations: Secondary | ICD-10-CM | POA: Diagnosis not present

## 2016-11-30 DIAGNOSIS — F331 Major depressive disorder, recurrent, moderate: Secondary | ICD-10-CM

## 2016-11-30 DIAGNOSIS — Z1211 Encounter for screening for malignant neoplasm of colon: Secondary | ICD-10-CM

## 2016-11-30 DIAGNOSIS — E785 Hyperlipidemia, unspecified: Secondary | ICD-10-CM

## 2016-11-30 DIAGNOSIS — J4520 Mild intermittent asthma, uncomplicated: Secondary | ICD-10-CM

## 2016-11-30 DIAGNOSIS — F1120 Opioid dependence, uncomplicated: Secondary | ICD-10-CM | POA: Diagnosis not present

## 2016-11-30 DIAGNOSIS — M150 Primary generalized (osteo)arthritis: Secondary | ICD-10-CM | POA: Diagnosis not present

## 2016-11-30 DIAGNOSIS — F411 Generalized anxiety disorder: Secondary | ICD-10-CM

## 2016-11-30 DIAGNOSIS — I10 Essential (primary) hypertension: Secondary | ICD-10-CM | POA: Diagnosis not present

## 2016-11-30 DIAGNOSIS — M159 Polyosteoarthritis, unspecified: Secondary | ICD-10-CM

## 2016-11-30 LAB — LIPID PANEL
CHOLESTEROL TOTAL: 247 mg/dL — AB (ref 100–199)
Chol/HDL Ratio: 4.7 ratio — ABNORMAL HIGH (ref 0.0–4.4)
HDL: 53 mg/dL (ref >39)
LDL Calculated: 146 mg/dL — ABNORMAL HIGH (ref 0–99)
TRIGLYCERIDES: 242 mg/dL — AB (ref 0–149)
VLDL Cholesterol Cal: 48 mg/dL — ABNORMAL HIGH (ref 5–40)

## 2016-11-30 LAB — CMP14+EGFR
A/G RATIO: 1.8 (ref 1.2–2.2)
ALK PHOS: 114 IU/L (ref 39–117)
ALT: 16 IU/L (ref 0–32)
AST: 13 IU/L (ref 0–40)
Albumin: 4.5 g/dL (ref 3.6–4.8)
BUN/Creatinine Ratio: 24 (ref 12–28)
BUN: 30 mg/dL — ABNORMAL HIGH (ref 8–27)
Bilirubin Total: 0.2 mg/dL (ref 0.0–1.2)
CO2: 20 mmol/L (ref 20–29)
Calcium: 9.4 mg/dL (ref 8.7–10.3)
Chloride: 104 mmol/L (ref 96–106)
Creatinine, Ser: 1.24 mg/dL — ABNORMAL HIGH (ref 0.57–1.00)
GFR calc Af Amer: 53 mL/min/{1.73_m2} — ABNORMAL LOW (ref >59)
GFR calc non Af Amer: 46 mL/min/{1.73_m2} — ABNORMAL LOW (ref >59)
GLOBULIN, TOTAL: 2.5 g/dL (ref 1.5–4.5)
Glucose: 88 mg/dL (ref 65–99)
POTASSIUM: 5 mmol/L (ref 3.5–5.2)
SODIUM: 140 mmol/L (ref 134–144)
Total Protein: 7 g/dL (ref 6.0–8.5)

## 2016-11-30 MED ORDER — HYDROCODONE-ACETAMINOPHEN 10-325 MG PO TABS: 1.0000 | ORAL_TABLET | Freq: Four times a day (QID) | ORAL | 0 refills | Status: DC | PRN

## 2016-11-30 MED ORDER — ALPRAZOLAM 0.5 MG PO TABS: 0.5000 mg | ORAL_TABLET | Freq: Every evening | ORAL | 3 refills | Status: DC | PRN

## 2016-11-30 MED ORDER — SERTRALINE HCL 50 MG PO TABS: 50.0000 mg | ORAL_TABLET | Freq: Every day | ORAL | 3 refills | Status: DC

## 2016-11-30 NOTE — Progress Notes (Signed)
Subjective:    Patient ID: Kathleen Saunders, female    DOB: 02/18/1951, 66 y.o.   MRN: [id number]  Pt presents to the office today for chronic follow up and pain medication refill.  Hypertension  This is a chronic problem. The current episode started more than 1 year ago. The problem has been waxing and waning since onset. The problem is uncontrolled. Associated symptoms include anxiety. Pertinent negatives include no blurred vision, peripheral edema or shortness of breath. Risk factors for coronary artery disease include diabetes mellitus, dyslipidemia, obesity and sedentary lifestyle. The current treatment provides moderate improvement. There is no history of kidney disease, CAD/MI or heart failure.  Hyperlipidemia  This is a chronic problem. The current episode started more than 1 year ago. The problem is uncontrolled. Recent lipid tests were reviewed and are high. Exacerbating diseases include obesity. Pertinent negatives include no shortness of breath. Current antihyperlipidemic treatment includes statins. The current treatment provides mild improvement of lipids.  Anxiety  Presents for follow-up visit. Symptoms include excessive worry, irritability, nervous/anxious behavior and restlessness. Patient reports no shortness of breath. Symptoms occur occasionally.   Her past medical history is significant for asthma.  Depression         This is a chronic problem.  The current episode started more than 1 year ago.   The onset quality is gradual.   The problem occurs intermittently.  The problem has been waxing and waning since onset.  Associated symptoms include restlessness, decreased interest and sad.  Associated symptoms include no helplessness and no hopelessness.  Past medical history includes anxiety.   Arthritis  Presents for follow-up visit. She complains of pain, stiffness and joint swelling. The symptoms have been stable. Affected locations include the left hip and right hip (back). Her  pain is at a severity of 8/10.  Asthma  There is no cough, shortness of breath or wheezing. This is a chronic problem. The current episode started more than 1 year ago. The problem occurs intermittently. The problem has been waxing and waning. Associated symptoms include nasal congestion. Her symptoms are alleviated by rest. She reports moderate improvement on treatment. Her past medical history is significant for asthma.      Review of Systems  Constitutional: Positive for irritability.  Eyes: Negative for blurred vision.  Respiratory: Negative for cough, shortness of breath and wheezing.   Musculoskeletal: Positive for arthritis, joint swelling and stiffness.  Psychiatric/Behavioral: Positive for depression. The patient is nervous/anxious.   All other systems reviewed and are negative.      Objective:   Physical Exam  Constitutional: She is oriented to person, place, and time. She appears well-developed and well-nourished. No distress.  Morbid obese   HENT:  Head: Normocephalic and atraumatic.  Right Ear: External ear normal.  Left Ear: External ear normal.  Nose: Nose normal.  Mouth/Throat: Oropharynx is clear and moist.  Eyes: Pupils are equal, round, and reactive to light.  Neck: Normal range of motion. Neck supple. No thyromegaly present.  Cardiovascular: Normal rate, regular rhythm, normal heart sounds and intact distal pulses.   No murmur heard. Pulmonary/Chest: Effort normal and breath sounds normal. No respiratory distress. She has no wheezes.  Abdominal: Soft. Bowel sounds are normal. She exhibits no distension. There is no tenderness.  Musculoskeletal: Normal range of motion. She exhibits no edema or tenderness.  Neurological: She is alert and oriented to person, place, and time.  Skin: Skin is warm and dry.  Psychiatric: She has a normal mood  and affect. Her behavior is normal. Judgment and thought content normal.  Vitals reviewed.    BP (!) 160/92   Pulse (!)  51   Temp 97.2 F (36.2 C) (Oral)   Ht _0  (1.6 m)   Wt 269 lb (122 kg)   BMI 47.65 kg/m      Assessment & Plan:  1. Essential hypertension - CMP14+EGFR  2. Moderate episode of recurrent major depressive disorder (HCC) - sertraline (ZOLOFT) 50 MG tablet; Take 1 tablet (50 mg total) by mouth daily.  Dispense: 90 tablet; Refill: 3 - ALPRAZolam (XANAX) 0.5 MG tablet; Take 1 tablet (0.5 mg total) by mouth at bedtime as needed for anxiety.  Dispense: 30 tablet; Refill: 3 - CMP14+EGFR  3. GAD (generalized anxiety disorder) - sertraline (ZOLOFT) 50 MG tablet; Take 1 tablet (50 mg total) by mouth daily.  Dispense: 90 tablet; Refill: 3 - ALPRAZolam (XANAX) 0.5 MG tablet; Take 1 tablet (0.5 mg total) by mouth at bedtime as needed for anxiety.  Dispense: 30 tablet; Refill: 3 - CMP14+EGFR  4. Hyperlipidemia, unspecified hyperlipidemia type - CMP14+EGFR - Lipid panel  5. Primary osteoarthritis involving multiple joints - HYDROcodone-acetaminophen (NORCO) 10-325 MG tablet; Take 1 tablet by mouth every 6 (six) hours as needed.  Dispense: 120 tablet; Refill: 0 - HYDROcodone-acetaminophen (NORCO) 10-325 MG tablet; Take 1 tablet by mouth every 6 (six) hours as needed.  Dispense: 120 tablet; Refill: 0 - HYDROcodone-acetaminophen (NORCO) 10-325 MG tablet; Take 1 tablet by mouth every 6 (six) hours as needed.  Dispense: 120 tablet; Refill: 0 - CMP14+EGFR  6. Mild intermittent asthma without complication - ZPH15+AVWP  7. Morbid obesity with BMI of 45.0-49.9, adult (HCC) - HYDROcodone-acetaminophen (NORCO) 10-325 MG tablet; Take 1 tablet by mouth every 6 (six) hours as needed.  Dispense: 120 tablet; Refill: 0 - HYDROcodone-acetaminophen (NORCO) 10-325 MG tablet; Take 1 tablet by mouth every 6 (six) hours as needed.  Dispense: 120 tablet; Refill: 0 - HYDROcodone-acetaminophen (NORCO) 10-325 MG tablet; Take 1 tablet by mouth every 6 (six) hours as needed.  Dispense: 120 tablet; Refill: 0 -  CMP14+EGFR  8. Uncomplicated opioid dependence (HCC) - HYDROcodone-acetaminophen (NORCO) 10-325 MG tablet; Take 1 tablet by mouth every 6 (six) hours as needed.  Dispense: 120 tablet; Refill: 0 - HYDROcodone-acetaminophen (NORCO) 10-325 MG tablet; Take 1 tablet by mouth every 6 (six) hours as needed.  Dispense: 120 tablet; Refill: 0 - HYDROcodone-acetaminophen (NORCO) 10-325 MG tablet; Take 1 tablet by mouth every 6 (six) hours as needed.  Dispense: 120 tablet; Refill: 0 - CMP14+EGFR  9. Pain medication agreement signed - HYDROcodone-acetaminophen (NORCO) 10-325 MG tablet; Take 1 tablet by mouth every 6 (six) hours as needed.  Dispense: 120 tablet; Refill: 0 - HYDROcodone-acetaminophen (NORCO) 10-325 MG tablet; Take 1 tablet by mouth every 6 (six) hours as needed.  Dispense: 120 tablet; Refill: 0 - HYDROcodone-acetaminophen (NORCO) 10-325 MG tablet; Take 1 tablet by mouth every 6 (six) hours as needed.  Dispense: 120 tablet; Refill: 0 - CMP14+EGFR  10. Colon cancer screening - Ambulatory referral to Gastroenterology - Fecal occult blood, imunochemical; Future   Continue all meds Labs pending Health Maintenance reviewed Diet and exercise encouraged RTO 3 months  Evelina Dun, FNP

## 2016-11-30 NOTE — Patient Instructions (Signed)

## 2016-12-01 ENCOUNTER — Other Ambulatory Visit: Payer: Self-pay | Admitting: Family

## 2016-12-01 MED ORDER — ATORVASTATIN CALCIUM 40 MG PO TABS: 40.0000 mg | ORAL_TABLET | Freq: Every day | ORAL | 11 refills | Status: DC

## 2016-12-20 ENCOUNTER — Ambulatory Visit (INDEPENDENT_AMBULATORY_CARE_PROVIDER_SITE_OTHER): Payer: PPO | Admitting: Family Medicine

## 2016-12-20 ENCOUNTER — Encounter: Payer: Self-pay | Admitting: Family Medicine

## 2016-12-20 VITALS — BP 134/64 | HR 62 | Temp 97.0°F | Ht 63.0 in | Wt 269.0 lb

## 2016-12-20 DIAGNOSIS — F1990 Other psychoactive substance use, unspecified, uncomplicated: Secondary | ICD-10-CM

## 2016-12-20 DIAGNOSIS — M5442 Lumbago with sciatica, left side: Secondary | ICD-10-CM | POA: Diagnosis not present

## 2016-12-20 MED ORDER — PANTOPRAZOLE SODIUM 40 MG PO TBEC: 40.0000 mg | DELAYED_RELEASE_TABLET | Freq: Every day | ORAL | 3 refills | Status: DC

## 2016-12-20 MED ORDER — PREDNISONE 20 MG PO TABS: ORAL_TABLET | ORAL | 0 refills | Status: DC

## 2016-12-20 MED ORDER — METHYLPREDNISOLONE ACETATE 80 MG/ML IJ SUSP
80.0000 mg | Freq: Once | INTRAMUSCULAR | Status: AC
  Administered 2016-12-20: 80 mg via INTRAMUSCULAR

## 2016-12-20 NOTE — Patient Instructions (Signed)
Great to meet you!   Sciatica Sciatica is pain, numbness, weakness, or tingling along the path of the sciatic nerve. The sciatic nerve starts in the lower back and runs down the back of each leg. The nerve controls the muscles in the lower leg and in the back of the knee. It also provides feeling (sensation) to the back of the thigh, the lower leg, and the sole of the foot. Sciatica is a symptom of another medical condition that pinches or puts pressure on the sciatic nerve. Generally, sciatica only affects one side of the body. Sciatica usually goes away on its own or with treatment. In some cases, sciatica may keep coming back (recur). What are the causes? This condition is caused by pressure on the sciatic nerve, or pinching of the sciatic nerve. This may be the result of:  A disk in between the bones of the spine (vertebrae) bulging out too far (herniated disk).  Age-related changes in the spinal disks (degenerative disk disease).  A pain disorder that affects a muscle in the buttock (piriformis syndrome).  Extra bone growth (bone spur) near the sciatic nerve.  An injury or break (fracture) of the pelvis.  Pregnancy.  Tumor (rare).  What increases the risk? The following factors may make you more likely to develop this condition:  Playing sports that place pressure or stress on the spine, such as football or weight lifting.  Having poor strength and flexibility.  A history of back injury.  A history of back surgery.  Sitting for long periods of time.  Doing activities that involve repetitive bending or lifting.  Obesity.  What are the signs or symptoms? Symptoms can vary from mild to very severe, and they may include:  Any of these problems in the lower back, leg, hip, or buttock: ? Mild tingling or dull aches. ? Burning sensations. ? Sharp pains.  Numbness in the back of the calf or the sole of the foot.  Leg weakness.  Severe back pain that makes movement  difficult.  These symptoms may get worse when you cough, sneeze, or laugh, or when you sit or stand for long periods of time. Being overweight may also make symptoms worse. In some cases, symptoms may recur over time. How is this diagnosed? This condition may be diagnosed based on:  Your symptoms.  A physical exam. Your health care provider may ask you to do certain movements to check whether those movements trigger your symptoms.  You may have tests, including: ? Blood tests. ? X-rays. ? MRI. ? CT scan.  How is this treated? In many cases, this condition improves on its own, without any treatment. However, treatment may include:  Reducing or modifying physical activity during periods of pain.  Exercising and stretching to strengthen your abdomen and improve the flexibility of your spine.  Icing and applying heat to the affected area.  Medicines that help: ? To relieve pain and swelling. ? To relax your muscles.  Injections of medicines that help to relieve pain, irritation, and inflammation around the sciatic nerve (steroids).  Surgery.  Follow these instructions at home: Medicines  Take over-the-counter and prescription medicines only as told by your health care provider.  Do not drive or operate heavy machinery while taking prescription pain medicine. Managing pain  If directed, apply ice to the affected area. ? Put ice in a plastic bag. ? Place a towel between your skin and the bag. ? Leave the ice on for 20 minutes, 2-3 times  a day.  After icing, apply heat to the affected area before you exercise or as often as told by your health care provider. Use the heat source that your health care provider recommends, such as a moist heat pack or a heating pad. ? Place a towel between your skin and the heat source. ? Leave the heat on for 20-30 minutes. ? Remove the heat if your skin turns bright red. This is especially important if you are unable to feel pain, heat, or  cold. You may have a greater risk of getting burned. Activity  Return to your normal activities as told by your health care provider. Ask your health care provider what activities are safe for you. ? Avoid activities that make your symptoms worse.  Take brief periods of rest throughout the day. Resting in a lying or standing position is usually better than sitting to rest. ? When you rest for longer periods, mix in some mild activity or stretching between periods of rest. This will help to prevent stiffness and pain. ? Avoid sitting for long periods of time without moving. Get up and move around at least one time each hour.  Exercise and stretch regularly, as told by your health care provider.  Do not lift anything that is heavier than 10 lb (4.5 kg) while you have symptoms of sciatica. When you do not have symptoms, you should still avoid heavy lifting, especially repetitive heavy lifting.  When you lift objects, always use proper lifting technique, which includes: ? Bending your knees. ? Keeping the load close to your body. ? Avoiding twisting. General instructions  Use good posture. ? Avoid leaning forward while sitting. ? Avoid hunching over while standing.  Maintain a healthy weight. Excess weight puts extra stress on your back and makes it difficult to maintain good posture.  Wear supportive, comfortable shoes. Avoid wearing high heels.  Avoid sleeping on a mattress that is too soft or too hard. A mattress that is firm enough to support your back when you sleep may help to reduce your pain.  Keep all follow-up visits as told by your health care provider. This is important. Contact a health care provider if:  You have pain that wakes you up when you are sleeping.  You have pain that gets worse when you lie down.  Your pain is worse than you have experienced in the past.  Your pain lasts longer than 4 weeks.  You experience unexplained weight loss. Get help right away  if:  You lose control of your bowel or bladder (incontinence).  You have: ? Weakness in your lower back, pelvis, buttocks, or legs that gets worse. ? Redness or swelling of your back. ? A burning sensation when you urinate. This information is not intended to replace advice given to you by your health care provider. Make sure you discuss any questions you have with your health care provider. Document Released: 05/18/2001 Document Revised: 10/28/2015 Document Reviewed: 01/31/2015 Elsevier Interactive Patient Education  2017 [employer]

## 2016-12-20 NOTE — Progress Notes (Signed)
   HPI  Patient presents today here with back pain.  Patient explains that she's been on hydrocodone chronically, she states that her doses are increasing and she is now taking 800 mg ibuprofen every 5 hours. She understands that this is an overdose and does not want to change it.  Patient states that she was cleaning a toilet Friday, 3 days ago, when she had acute onset left-sided low back pain and inability to stand up. She suffered through the weekend with severe back pain and radiation down the left posterior leg. She denies any leg weakness.  The most severe problem is trying to stand back up.  She would like to see Dr. Shon BatonBrooks, orthopedics  PMH: Smoking status noted ROS: Per HPI  Objective: BP 134/64   Pulse 62   Temp (!) 97 F (36.1 C) (Oral)   Ht 5\' 3"  (1.6 m)   Wt 269 lb (122 kg)   BMI 47.65 kg/m  Gen: NAD, alert, cooperative with exam HEENT: NCAT CV: RRR, good S1/S2, no murmur Resp: CTABL, no wheezes, non-labored Ext: No edema, warm Neuro: Alert and oriented, No gross deficits MSK:  Tenderness to palpation of paraspinal muscles in the left side, no midline tenderness Negative straight leg raise, modified. 2+ patellar tendon reflex compared to 1+ patellar tendon reflex on the right  Assessment and plan:  # Acute left-sided low back pain with sciatica Patient already severe lower back pain on chronic narcotics. Given IM Depo-Medrol plus oral prednisone to treat acute irritation of sciatic nerve. Refer to orthopedics given worsening chronic back issues.  # Excessive use of NSAIDs Explained that she is taking too much ibuprofen, no more than 103 times daily Add PPI for PUD prophylaxis    Orders Placed This Encounter  Procedures  . Ambulatory referral to Orthopedic Surgery    Referral Priority:   Routine    Referral Type:   Surgical    Referral Reason:   Specialty Services Required    Referred to Provider:   Venita LickBrooks, Dahari, MD    Requested Specialty:    Orthopedic Surgery    Number of Visits Requested:   1    Meds ordered this encounter  Medications  . predniSONE (DELTASONE) 20 MG tablet    Sig: 2 po at sametime daily for 5 days    Dispense:  10 tablet    Refill:  0  . pantoprazole (PROTONIX) 40 MG tablet    Sig: Take 1 tablet (40 mg total) by mouth daily.    Dispense:  30 tablet    Refill:  3  . methylPREDNISolone acetate (DEPO-MEDROL) injection 80 mg    Murtis SinkSam Bradshaw, MD [facility] 12/20/2016, 10:49 AM

## 2016-12-22 ENCOUNTER — Telehealth: Payer: Self-pay | Admitting: *Deleted

## 2016-12-22 NOTE — Telephone Encounter (Signed)
Patient is requesting refill on prednisone. It has helped some and has jury duty next week.

## 2016-12-23 MED ORDER — PREDNISONE 10 MG PO TABS: ORAL_TABLET | ORAL | 0 refills | Status: DC

## 2016-12-23 NOTE — Telephone Encounter (Signed)
Rx sent, taper.   Murtis SinkSam Mykale Gandolfo, MD [facility] 12/23/2016, 7:51 AM

## 2016-12-28 ENCOUNTER — Other Ambulatory Visit: Payer: Self-pay | Admitting: Family

## 2016-12-29 DIAGNOSIS — M4306 Spondylolysis, lumbar region: Secondary | ICD-10-CM | POA: Diagnosis not present

## 2016-12-29 DIAGNOSIS — M545 Low back pain: Secondary | ICD-10-CM | POA: Diagnosis not present

## 2016-12-29 DIAGNOSIS — M4686 Other specified inflammatory spondylopathies, lumbar region: Secondary | ICD-10-CM | POA: Diagnosis not present

## 2016-12-29 DIAGNOSIS — M5136 Other intervertebral disc degeneration, lumbar region: Secondary | ICD-10-CM | POA: Diagnosis not present

## 2017-01-03 ENCOUNTER — Encounter: Payer: Self-pay | Admitting: Family

## 2017-01-03 ENCOUNTER — Encounter: Payer: PPO | Admitting: *Deleted

## 2017-01-03 ENCOUNTER — Ambulatory Visit (INDEPENDENT_AMBULATORY_CARE_PROVIDER_SITE_OTHER): Payer: PPO | Admitting: Family

## 2017-01-03 VITALS — BP 125/70 | HR 63 | Temp 97.1°F | Ht 63.0 in | Wt 276.0 lb

## 2017-01-03 DIAGNOSIS — Z01419 Encounter for gynecological examination (general) (routine) without abnormal findings: Secondary | ICD-10-CM | POA: Diagnosis not present

## 2017-01-03 DIAGNOSIS — G4733 Obstructive sleep apnea (adult) (pediatric): Secondary | ICD-10-CM | POA: Diagnosis not present

## 2017-01-03 DIAGNOSIS — Z0289 Encounter for other administrative examinations: Secondary | ICD-10-CM

## 2017-01-03 DIAGNOSIS — Z9989 Dependence on other enabling machines and devices: Secondary | ICD-10-CM

## 2017-01-03 DIAGNOSIS — I10 Essential (primary) hypertension: Secondary | ICD-10-CM | POA: Diagnosis not present

## 2017-01-03 DIAGNOSIS — F1120 Opioid dependence, uncomplicated: Secondary | ICD-10-CM

## 2017-01-03 DIAGNOSIS — Z6841 Body Mass Index (BMI) 40.0 and over, adult: Secondary | ICD-10-CM

## 2017-01-03 DIAGNOSIS — F331 Major depressive disorder, recurrent, moderate: Secondary | ICD-10-CM

## 2017-01-03 DIAGNOSIS — F411 Generalized anxiety disorder: Secondary | ICD-10-CM | POA: Diagnosis not present

## 2017-01-03 DIAGNOSIS — J4520 Mild intermittent asthma, uncomplicated: Secondary | ICD-10-CM | POA: Diagnosis not present

## 2017-01-03 DIAGNOSIS — Z0000 Encounter for general adult medical examination without abnormal findings: Secondary | ICD-10-CM

## 2017-01-03 DIAGNOSIS — M150 Primary generalized (osteo)arthritis: Secondary | ICD-10-CM | POA: Diagnosis not present

## 2017-01-03 DIAGNOSIS — M159 Polyosteoarthritis, unspecified: Secondary | ICD-10-CM

## 2017-01-03 DIAGNOSIS — E785 Hyperlipidemia, unspecified: Secondary | ICD-10-CM

## 2017-01-03 MED ORDER — METHOCARBAMOL 500 MG PO TABS: 500.0000 mg | ORAL_TABLET | Freq: Four times a day (QID) | ORAL | 3 refills | Status: DC | PRN

## 2017-01-03 MED ORDER — HYDROCODONE-ACETAMINOPHEN 10-325 MG PO TABS: 1.0000 | ORAL_TABLET | Freq: Four times a day (QID) | ORAL | 0 refills | Status: DC | PRN

## 2017-01-03 MED ORDER — ALPRAZOLAM 0.5 MG PO TABS: 0.5000 mg | ORAL_TABLET | Freq: Every evening | ORAL | 3 refills | Status: DC | PRN

## 2017-01-03 NOTE — Progress Notes (Signed)
Subjective:    Patient ID: Kathleen Saunders, female    DOB: 06-Sep-1950, 66 y.o.   MRN: [id number]  Pt presents to the office today for CPE and pain medication refill. Pt has MRI scheduled 01/05/17 for chronic back pain and has seen Ortho.  Gynecologic Exam  The patient's pertinent negatives include no genital itching, genital odor or vaginal discharge. The current episode started more than 1 year ago. The patient is experiencing no pain.  Hypertension  This is a chronic problem. The current episode started more than 1 year ago. The problem has been resolved since onset. The problem is controlled. Associated symptoms include anxiety and shortness of breath ("at times"). Pertinent negatives include no malaise/fatigue or peripheral edema. Risk factors for coronary artery disease include dyslipidemia, obesity, post-menopausal state and sedentary lifestyle. The current treatment provides moderate improvement. There is no history of kidney disease, CVA or heart failure.  Asthma  She complains of shortness of breath ("at times") and wheezing ("at times"). There is no cough or frequent throat clearing. This is a chronic problem. The current episode started more than 1 year ago. The problem occurs intermittently. The problem has been waxing and waning. Pertinent negatives include no malaise/fatigue. Her symptoms are alleviated by beta-agonist. Her past medical history is significant for asthma.  Arthritis  Presents for follow-up visit. She complains of pain and stiffness. The symptoms have been worsening. Affected locations include the right knee, left knee, right elbow, left shoulder, left elbow and right shoulder (back). Her pain is at a severity of 3/10.  Depression         This is a chronic problem.  The current episode started more than 1 year ago.   The onset quality is gradual.   The problem occurs intermittently.  The problem has been waxing and waning since onset.  Associated symptoms include  irritable, restlessness and sad.  Associated symptoms include no helplessness and no hopelessness.  Compliance with treatment is good.  Past medical history includes anxiety.   Anxiety  Presents for follow-up visit. Symptoms include depressed mood, excessive worry, irritability, nervous/anxious behavior, restlessness and shortness of breath ("at times"). Symptoms occur occasionally.   Her past medical history is significant for asthma.  Hyperlipidemia  This is a chronic problem. The current episode started more than 1 year ago. The problem is uncontrolled. Recent lipid tests were reviewed and are high. Exacerbating diseases include obesity. Associated symptoms include shortness of breath ("at times"). The current treatment provides mild improvement of lipids. Risk factors for coronary artery disease include dyslipidemia, diabetes mellitus, obesity, hypertension, a sedentary lifestyle and post-menopausal.  OSA PT not using CPAP, but her mask does not fit.    Review of Systems  Constitutional: Positive for irritability. Negative for malaise/fatigue.  Respiratory: Positive for shortness of breath ("at times") and wheezing ("at times"). Negative for cough.   Genitourinary: Negative for vaginal discharge.  Musculoskeletal: Positive for arthritis and stiffness.  Psychiatric/Behavioral: Positive for depression. The patient is nervous/anxious.   All other systems reviewed and are negative.      Objective:   Physical Exam  Constitutional: She is oriented to person, place, and time. She appears well-developed and well-nourished. She is irritable. No distress.  HENT:  Head: Normocephalic and atraumatic.  Right Ear: External ear normal.  Left Ear: External ear normal.  Nose: Nose normal.  Mouth/Throat: Oropharynx is clear and moist.  Eyes: Pupils are equal, round, and reactive to light.  Neck: Normal range of motion. Neck  supple. No thyromegaly present.  Cardiovascular: Normal rate, regular  rhythm, normal heart sounds and intact distal pulses.   No murmur heard. Pulmonary/Chest: Effort normal and breath sounds normal. No respiratory distress. She has no wheezes. Right breast exhibits no inverted nipple, no mass, no nipple discharge, no skin change and no tenderness. Left breast exhibits no inverted nipple, no mass, no nipple discharge, no skin change and no tenderness. Breasts are symmetrical.  Abdominal: Soft. Bowel sounds are normal. She exhibits no distension. There is no tenderness.  Genitourinary: Vagina normal.  Genitourinary Comments: Bimanual exam- no adnexal masses or tenderness, ovaries nonpalpable    No discharge   Musculoskeletal: Normal range of motion. She exhibits no edema or tenderness.  Neurological: She is alert and oriented to person, place, and time.  Skin: Skin is warm and dry.  Psychiatric: She has a normal mood and affect. Her behavior is normal. Judgment and thought content normal.  Vitals reviewed.    BP 125/70   Pulse 63   Temp (!) 97.1 F (36.2 C) (Oral)   Ht _0  (1.6 m)   Wt 276 lb (125.2 kg)   BMI 48.89 kg/m      Assessment & Plan:  1. Essential hypertension - CMP14+EGFR  2. OSA on CPAP - CMP14+EGFR  3. Mild intermittent asthma without complication - YJE56+DJSH  4. Primary osteoarthritis involving multiple joints - HYDROcodone-acetaminophen (NORCO) 10-325 MG tablet; Take 1 tablet by mouth every 6 (six) hours as needed.  Dispense: 120 tablet; Refill: 0 - HYDROcodone-acetaminophen (NORCO) 10-325 MG tablet; Take 1 tablet by mouth every 6 (six) hours as needed.  Dispense: 120 tablet; Refill: 0 - HYDROcodone-acetaminophen (NORCO) 10-325 MG tablet; Take 1 tablet by mouth every 6 (six) hours as needed.  Dispense: 120 tablet; Refill: 0 - CMP14+EGFR - methocarbamol (ROBAXIN) 500 MG tablet; Take 1 tablet (500 mg total) by mouth every 6 (six) hours as needed for muscle spasms.  Dispense: 120 tablet; Refill: 3  5. Pain medication agreement  signed - HYDROcodone-acetaminophen (NORCO) 10-325 MG tablet; Take 1 tablet by mouth every 6 (six) hours as needed.  Dispense: 120 tablet; Refill: 0 - HYDROcodone-acetaminophen (NORCO) 10-325 MG tablet; Take 1 tablet by mouth every 6 (six) hours as needed.  Dispense: 120 tablet; Refill: 0 - HYDROcodone-acetaminophen (NORCO) 10-325 MG tablet; Take 1 tablet by mouth every 6 (six) hours as needed.  Dispense: 120 tablet; Refill: 0 - CMP14+EGFR  6. Uncomplicated opioid dependence (HCC) - HYDROcodone-acetaminophen (NORCO) 10-325 MG tablet; Take 1 tablet by mouth every 6 (six) hours as needed.  Dispense: 120 tablet; Refill: 0 - HYDROcodone-acetaminophen (NORCO) 10-325 MG tablet; Take 1 tablet by mouth every 6 (six) hours as needed.  Dispense: 120 tablet; Refill: 0 - HYDROcodone-acetaminophen (NORCO) 10-325 MG tablet; Take 1 tablet by mouth every 6 (six) hours as needed.  Dispense: 120 tablet; Refill: 0 - CMP14+EGFR  7. Morbid obesity with BMI of 45.0-49.9, adult (HCC) - HYDROcodone-acetaminophen (NORCO) 10-325 MG tablet; Take 1 tablet by mouth every 6 (six) hours as needed.  Dispense: 120 tablet; Refill: 0 - HYDROcodone-acetaminophen (NORCO) 10-325 MG tablet; Take 1 tablet by mouth every 6 (six) hours as needed.  Dispense: 120 tablet; Refill: 0 - HYDROcodone-acetaminophen (NORCO) 10-325 MG tablet; Take 1 tablet by mouth every 6 (six) hours as needed.  Dispense: 120 tablet; Refill: 0 - CMP14+EGFR  8. Moderate episode of recurrent major depressive disorder (HCC) - ALPRAZolam (XANAX) 0.5 MG tablet; Take 1 tablet (0.5 mg total) by mouth at bedtime as  needed for anxiety.  Dispense: 30 tablet; Refill: 3 - CMP14+EGFR  9. GAD (generalized anxiety disorder) - ALPRAZolam (XANAX) 0.5 MG tablet; Take 1 tablet (0.5 mg total) by mouth at bedtime as needed for anxiety.  Dispense: 30 tablet; Refill: 3 - CMP14+EGFR  10. Hyperlipidemia, unspecified hyperlipidemia type - CMP14+EGFR  11. Annual physical exam -  CMP14+EGFR - Lipid panel - CBC with Differential/Platelet - Thyroid Panel With TSH - VITAMIN D 25 Hydroxy (Vit-D Deficiency, Fractures) - Pap IG w/ reflex to HPV when ASC-U  12. Gynecologic exam normal - CMP14+EGFR - Pap IG w/ reflex to HPV when ASC-U   Continue all meds Labs pending Health Maintenance reviewed Diet and exercise encouraged RTO 3 months   Evelina Dun, FNP

## 2017-01-03 NOTE — Patient Instructions (Signed)

## 2017-01-04 LAB — CBC WITH DIFFERENTIAL/PLATELET
BASOS ABS: 0.1 10*3/uL (ref 0.0–0.2)
BASOS: 0 %
EOS (ABSOLUTE): 0.1 10*3/uL (ref 0.0–0.4)
Eos: 1 %
HEMOGLOBIN: 9.6 g/dL — AB (ref 11.1–15.9)
Hematocrit: 30.2 % — ABNORMAL LOW (ref 34.0–46.6)
IMMATURE GRANS (ABS): 0.1 10*3/uL (ref 0.0–0.1)
IMMATURE GRANULOCYTES: 1 %
LYMPHS: 17 %
Lymphocytes Absolute: 2.1 10*3/uL (ref 0.7–3.1)
MCH: 28.7 pg (ref 26.6–33.0)
MCHC: 31.8 g/dL (ref 31.5–35.7)
MCV: 90 fL (ref 79–97)
MONOCYTES: 5 %
Monocytes Absolute: 0.6 10*3/uL (ref 0.1–0.9)
NEUTROS ABS: 9.8 10*3/uL — AB (ref 1.4–7.0)
NEUTROS PCT: 76 %
PLATELETS: 361 10*3/uL (ref 150–379)
RBC: 3.35 x10E6/uL — ABNORMAL LOW (ref 3.77–5.28)
RDW: 14.6 % (ref 12.3–15.4)
WBC: 12.7 10*3/uL — ABNORMAL HIGH (ref 3.4–10.8)

## 2017-01-04 LAB — PAP IG W/ RFLX HPV ASCU: PAP Smear Comment: 0

## 2017-01-04 LAB — VITAMIN D 25 HYDROXY (VIT D DEFICIENCY, FRACTURES): VIT D 25 HYDROXY: 5.9 ng/mL — AB (ref 30.0–100.0)

## 2017-01-04 LAB — THYROID PANEL WITH TSH
FREE THYROXINE INDEX: 2.3 (ref 1.2–4.9)
T3 UPTAKE RATIO: 30 % (ref 24–39)
T4, Total: 7.5 ug/dL (ref 4.5–12.0)
TSH: 0.661 u[IU]/mL (ref 0.450–4.500)

## 2017-01-04 LAB — CMP14+EGFR
A/G RATIO: 1.7 (ref 1.2–2.2)
ALK PHOS: 84 IU/L (ref 39–117)
ALT: 31 IU/L (ref 0–32)
AST: 16 IU/L (ref 0–40)
Albumin: 3.8 g/dL (ref 3.6–4.8)
BUN / CREAT RATIO: 22 (ref 12–28)
BUN: 23 mg/dL (ref 8–27)
Bilirubin Total: <0.2 mg/dL (ref 0.0–1.2)
CO2: 26 mmol/L (ref 20–29)
Calcium: 8.9 mg/dL (ref 8.7–10.3)
Chloride: 101 mmol/L (ref 96–106)
Creatinine, Ser: 1.06 mg/dL — ABNORMAL HIGH (ref 0.57–1.00)
GFR calc Af Amer: 64 mL/min/{1.73_m2} (ref >59)
GFR calc non Af Amer: 56 mL/min/{1.73_m2} — ABNORMAL LOW (ref >59)
GLOBULIN, TOTAL: 2.2 g/dL (ref 1.5–4.5)
Glucose: 123 mg/dL — ABNORMAL HIGH (ref 65–99)
POTASSIUM: 5.3 mmol/L — AB (ref 3.5–5.2)
SODIUM: 140 mmol/L (ref 134–144)
Total Protein: 6 g/dL (ref 6.0–8.5)

## 2017-01-04 LAB — LIPID PANEL
CHOL/HDL RATIO: 3.1 ratio (ref 0.0–4.4)
CHOLESTEROL TOTAL: 222 mg/dL — AB (ref 100–199)
HDL: 72 mg/dL (ref >39)
LDL Calculated: 122 mg/dL — ABNORMAL HIGH (ref 0–99)
TRIGLYCERIDES: 139 mg/dL (ref 0–149)
VLDL Cholesterol Cal: 28 mg/dL (ref 5–40)

## 2017-01-05 ENCOUNTER — Other Ambulatory Visit: Payer: Self-pay | Admitting: Family

## 2017-01-05 DIAGNOSIS — M5136 Other intervertebral disc degeneration, lumbar region: Secondary | ICD-10-CM | POA: Diagnosis not present

## 2017-01-05 DIAGNOSIS — E559 Vitamin D deficiency, unspecified: Secondary | ICD-10-CM

## 2017-01-05 MED ORDER — SIMVASTATIN 20 MG PO TABS: 20.0000 mg | ORAL_TABLET | Freq: Every evening | ORAL | 11 refills | Status: DC

## 2017-01-05 MED ORDER — VITAMIN D (ERGOCALCIFEROL) 1.25 MG (50000 UNIT) PO CAPS: 50000.0000 [IU] | ORAL_CAPSULE | ORAL | 3 refills | Status: DC

## 2017-01-18 DIAGNOSIS — M4686 Other specified inflammatory spondylopathies, lumbar region: Secondary | ICD-10-CM | POA: Diagnosis not present

## 2017-01-18 DIAGNOSIS — M4306 Spondylolysis, lumbar region: Secondary | ICD-10-CM | POA: Diagnosis not present

## 2017-01-18 DIAGNOSIS — M5136 Other intervertebral disc degeneration, lumbar region: Secondary | ICD-10-CM | POA: Diagnosis not present

## 2017-01-31 ENCOUNTER — Encounter: Payer: Self-pay | Admitting: Family

## 2017-02-21 ENCOUNTER — Ambulatory Visit (INDEPENDENT_AMBULATORY_CARE_PROVIDER_SITE_OTHER): Payer: PPO | Admitting: Family

## 2017-02-21 ENCOUNTER — Encounter: Payer: Self-pay | Admitting: Family

## 2017-02-21 VITALS — BP 135/77 | HR 69 | Temp 97.9°F | Ht 63.0 in | Wt 283.0 lb

## 2017-02-21 DIAGNOSIS — M150 Primary generalized (osteo)arthritis: Secondary | ICD-10-CM | POA: Diagnosis not present

## 2017-02-21 DIAGNOSIS — M549 Dorsalgia, unspecified: Secondary | ICD-10-CM

## 2017-02-21 DIAGNOSIS — M5441 Lumbago with sciatica, right side: Secondary | ICD-10-CM

## 2017-02-21 DIAGNOSIS — G8929 Other chronic pain: Secondary | ICD-10-CM | POA: Diagnosis not present

## 2017-02-21 DIAGNOSIS — M159 Polyosteoarthritis, unspecified: Secondary | ICD-10-CM

## 2017-02-21 MED ORDER — METHYLPREDNISOLONE ACETATE 80 MG/ML IJ SUSP
80.0000 mg | Freq: Once | INTRAMUSCULAR | Status: AC
  Administered 2017-02-21: 80 mg via INTRAMUSCULAR

## 2017-02-21 MED ORDER — KETOROLAC TROMETHAMINE 60 MG/2ML IM SOLN
60.0000 mg | Freq: Once | INTRAMUSCULAR | Status: AC
  Administered 2017-02-21: 60 mg via INTRAMUSCULAR

## 2017-02-21 MED ORDER — GABAPENTIN 300 MG PO CAPS: 300.0000 mg | ORAL_CAPSULE | Freq: Two times a day (BID) | ORAL | 2 refills | Status: DC

## 2017-02-21 NOTE — Progress Notes (Signed)
   Subjective:    Patient ID: Kathleen Saunders, female    DOB: 1950-09-20, 66 y.o.   MRN: [id number]  PT presents to the office today with chronic back pain. Pt has ortho appt for back injection 03/03/17.  Back Pain  This is a chronic problem. The current episode started more than 1 year ago. The problem occurs constantly. The problem has been waxing and waning since onset. The pain is present in the lumbar spine. The quality of the pain is described as aching. The pain radiates to the right thigh. The pain is at a severity of 10/10. The pain is moderate. The symptoms are aggravated by lying down. Associated symptoms include leg pain and weakness. Pertinent negatives include no bladder incontinence or bowel incontinence. She has tried analgesics, bed rest, muscle relaxant and NSAIDs for the symptoms. The treatment provided mild relief.      Review of Systems  Gastrointestinal: Negative for bowel incontinence.  Genitourinary: Negative for bladder incontinence.  Musculoskeletal: Positive for back pain.  Neurological: Positive for weakness.  All other systems reviewed and are negative.      Objective:   Physical Exam  Constitutional: She is oriented to person, place, and time. She appears well-developed and well-nourished. No distress.  HENT:  Head: Normocephalic.  Cardiovascular: Normal rate, regular rhythm, normal heart sounds and intact distal pulses.   No murmur heard. Pulmonary/Chest: Effort normal and breath sounds normal. No respiratory distress. She has no wheezes.  Abdominal: Soft. Bowel sounds are normal. She exhibits no distension. There is no tenderness.  Musculoskeletal: Normal range of motion. She exhibits no edema or tenderness.  Neurological: She is alert and oriented to person, place, and time.  Skin: Skin is warm and dry.  Psychiatric: She has a normal mood and affect. Her behavior is normal. Judgment and thought content normal.  Vitals reviewed.     BP 135/77    Pulse 69   Temp 97.9 F (36.6 C) (Oral)   Ht  (1.6 m)   Wt 283 lb (128.4 kg)   BMI 50.13 kg/m      Assessment & Plan:  1. Primary osteoarthritis involving multiple joints - methylPREDNISolone acetate (DEPO-MEDROL) injection 80 mg; Inject 1 mL (80 mg total) into the muscle once. - ketorolac (TORADOL) injection 60 mg; Inject 2 mLs (60 mg total) into the muscle once.  2. Chronic bilateral low back pain with right-sided sciatica - methylPREDNISolone acetate (DEPO-MEDROL) injection 80 mg; Inject 1 mL (80 mg total) into the muscle once. - ketorolac (TORADOL) injection 60 mg; Inject 2 mLs (60 mg total) into the muscle once. - gabapentin (NEURONTIN) 300 MG capsule; Take 1 capsule (300 mg total) by mouth 2 (two) times daily.  Dispense: 60 capsule; Refill: 2   Rest Ice and heat as needed ROM exercises discussed Keep Ortho appt PT started on Gabapentin 300 mg BID today, told not to take with Norc, sedation precautions discussed   Jannifer Rodney, FNP

## 2017-02-21 NOTE — Patient Instructions (Signed)

## 2017-03-03 ENCOUNTER — Other Ambulatory Visit: Payer: Self-pay | Admitting: Family

## 2017-03-03 DIAGNOSIS — M5136 Other intervertebral disc degeneration, lumbar region: Secondary | ICD-10-CM | POA: Diagnosis not present

## 2017-03-03 MED ORDER — OXYCODONE-ACETAMINOPHEN 7.5-325 MG PO TABS: 1.0000 | ORAL_TABLET | Freq: Three times a day (TID) | ORAL | 0 refills | Status: DC | PRN

## 2017-03-03 NOTE — Progress Notes (Signed)
Pt's pain is worse and not improving. Pt has appt with Ortho for injection of the spine. Will change Norco to oxycodone. Discussed not taking these together and keep all follow up appts with me and Ortho.

## 2017-03-05 DIAGNOSIS — H04123 Dry eye syndrome of bilateral lacrimal glands: Secondary | ICD-10-CM | POA: Diagnosis not present

## 2017-03-05 DIAGNOSIS — H40033 Anatomical narrow angle, bilateral: Secondary | ICD-10-CM | POA: Diagnosis not present

## 2017-04-05 ENCOUNTER — Encounter: Payer: Self-pay | Admitting: Neurology

## 2017-04-16 ENCOUNTER — Other Ambulatory Visit: Payer: Self-pay | Admitting: Family Medicine

## 2017-05-14 ENCOUNTER — Other Ambulatory Visit: Payer: Self-pay | Admitting: Family

## 2017-05-14 DIAGNOSIS — M159 Polyosteoarthritis, unspecified: Secondary | ICD-10-CM

## 2017-05-14 DIAGNOSIS — M150 Primary generalized (osteo)arthritis: Principal | ICD-10-CM

## 2017-05-14 MED ORDER — DOXYCYCLINE HYCLATE 100 MG PO TABS: 100.0000 mg | ORAL_TABLET | Freq: Two times a day (BID) | ORAL | 0 refills | Status: DC

## 2017-05-14 MED ORDER — PREDNISONE 10 MG (21) PO TBPK: ORAL_TABLET | ORAL | 0 refills | Status: DC

## 2017-05-17 ENCOUNTER — Other Ambulatory Visit: Payer: Self-pay | Admitting: Family

## 2017-05-17 DIAGNOSIS — M150 Primary generalized (osteo)arthritis: Principal | ICD-10-CM

## 2017-05-17 DIAGNOSIS — M159 Polyosteoarthritis, unspecified: Secondary | ICD-10-CM

## 2017-05-17 NOTE — Telephone Encounter (Signed)
I do not see on patients medication list 

## 2017-05-29 ENCOUNTER — Other Ambulatory Visit: Payer: Self-pay | Admitting: Family

## 2017-05-29 DIAGNOSIS — I10 Essential (primary) hypertension: Secondary | ICD-10-CM

## 2017-06-03 ENCOUNTER — Other Ambulatory Visit: Payer: Self-pay

## 2017-06-03 MED ORDER — OXYCODONE-ACETAMINOPHEN 7.5-325 MG PO TABS: 1.0000 | ORAL_TABLET | Freq: Three times a day (TID) | ORAL | 0 refills | Status: DC | PRN

## 2017-06-13 ENCOUNTER — Other Ambulatory Visit: Payer: Self-pay | Admitting: Family

## 2017-06-13 MED ORDER — AMOXICILLIN 500 MG PO TABS: 500.0000 mg | ORAL_TABLET | Freq: Two times a day (BID) | ORAL | 0 refills | Status: DC

## 2017-06-15 ENCOUNTER — Other Ambulatory Visit: Payer: Self-pay | Admitting: Family

## 2017-06-15 DIAGNOSIS — M159 Polyosteoarthritis, unspecified: Secondary | ICD-10-CM

## 2017-06-15 DIAGNOSIS — M150 Primary generalized (osteo)arthritis: Principal | ICD-10-CM

## 2017-06-28 ENCOUNTER — Ambulatory Visit (INDEPENDENT_AMBULATORY_CARE_PROVIDER_SITE_OTHER): Payer: PPO | Admitting: Family

## 2017-06-28 ENCOUNTER — Encounter: Payer: Self-pay | Admitting: Family

## 2017-06-28 VITALS — BP 154/91 | HR 60 | Temp 98.3°F | Ht 63.0 in | Wt 284.8 lb

## 2017-06-28 DIAGNOSIS — F411 Generalized anxiety disorder: Secondary | ICD-10-CM

## 2017-06-28 DIAGNOSIS — I10 Essential (primary) hypertension: Secondary | ICD-10-CM

## 2017-06-28 DIAGNOSIS — M150 Primary generalized (osteo)arthritis: Secondary | ICD-10-CM

## 2017-06-28 DIAGNOSIS — Z6841 Body Mass Index (BMI) 40.0 and over, adult: Secondary | ICD-10-CM

## 2017-06-28 DIAGNOSIS — Z0289 Encounter for other administrative examinations: Secondary | ICD-10-CM

## 2017-06-28 DIAGNOSIS — E785 Hyperlipidemia, unspecified: Secondary | ICD-10-CM

## 2017-06-28 DIAGNOSIS — M5441 Lumbago with sciatica, right side: Secondary | ICD-10-CM

## 2017-06-28 DIAGNOSIS — G8929 Other chronic pain: Secondary | ICD-10-CM

## 2017-06-28 DIAGNOSIS — F1120 Opioid dependence, uncomplicated: Secondary | ICD-10-CM | POA: Diagnosis not present

## 2017-06-28 DIAGNOSIS — J4520 Mild intermittent asthma, uncomplicated: Secondary | ICD-10-CM

## 2017-06-28 DIAGNOSIS — F331 Major depressive disorder, recurrent, moderate: Secondary | ICD-10-CM

## 2017-06-28 DIAGNOSIS — M159 Polyosteoarthritis, unspecified: Secondary | ICD-10-CM

## 2017-06-28 MED ORDER — OXYCODONE-ACETAMINOPHEN 7.5-325 MG PO TABS: 1.0000 | ORAL_TABLET | Freq: Four times a day (QID) | ORAL | 0 refills | Status: DC | PRN

## 2017-06-28 MED ORDER — ALPRAZOLAM 0.5 MG PO TABS: 0.5000 mg | ORAL_TABLET | Freq: Every evening | ORAL | 3 refills | Status: DC | PRN

## 2017-06-28 MED ORDER — OXYCODONE-ACETAMINOPHEN 7.5-325 MG PO TABS
1.0000 | ORAL_TABLET | Freq: Four times a day (QID) | ORAL | 0 refills | Status: DC | PRN
Start: 2017-06-28 — End: 2017-10-28

## 2017-06-28 NOTE — Patient Instructions (Signed)

## 2017-06-28 NOTE — Progress Notes (Signed)
Subjective:    Patient ID: Kathleen Saunders, female    DOB: [birth year], 67 y.o.   MRN: [id number]  PT presents to the office today for chronic follow up and pain medication for chronic back pain.  Hypertension  This is a chronic problem. The current episode started more than 1 year ago. The problem has been waxing and waning since onset. The problem is uncontrolled. Associated symptoms include anxiety and malaise/fatigue. Pertinent negatives include no peripheral edema or shortness of breath. Risk factors for coronary artery disease include dyslipidemia, obesity and sedentary lifestyle. The current treatment provides mild improvement. There is no history of kidney disease, CAD/MI, CVA or heart failure.  Hyperlipidemia  This is a chronic problem. The current episode started more than 1 year ago. The problem is uncontrolled. Recent lipid tests were reviewed and are high. Exacerbating diseases include obesity. Pertinent negatives include no shortness of breath. She is currently on no antihyperlipidemic treatment. The current treatment provides no improvement of lipids. Risk factors for coronary artery disease include diabetes mellitus, hypertension, obesity and a sedentary lifestyle.  Arthritis  Presents for follow-up visit. She complains of pain and stiffness. The symptoms have been stable. Affected locations include the right knee and left knee (back pain). Her pain is at a severity of 10/10.  Back Pain  This is a chronic problem. The current episode started more than 1 year ago. The problem occurs intermittently. The problem has been waxing and waning since onset. The pain is present in the lumbar spine. The quality of the pain is described as aching. The pain is at a severity of 9/10. The pain is moderate. She has tried analgesics, bed rest and NSAIDs for the symptoms. The treatment provided mild relief.  Depression         This is a chronic problem.  The current episode started more than 1 year ago.    The problem occurs intermittently.  The problem has been waxing and waning since onset.  Associated symptoms include irritable and restlessness.  Associated symptoms include no helplessness and no hopelessness.  Past treatments include SSRIs - Selective serotonin reuptake inhibitors.  Compliance with treatment is good.  Past medical history includes anxiety.   Anxiety  Presents for follow-up visit. Symptoms include excessive worry, irritability, nervous/anxious behavior and restlessness. Patient reports no shortness of breath. Symptoms occur occasionally. The severity of symptoms is moderate.   Her past medical history is significant for asthma.  Gastroesophageal Reflux  She complains of belching, heartburn and a hoarse voice. This is a chronic problem. The current episode started more than 1 year ago. The problem occurs occasionally. The problem has been waxing and waning. The heartburn is of mild intensity. Risk factors include obesity. She has tried a PPI for the symptoms. The treatment provided moderate relief.  Asthma  She complains of hoarse voice. There is no difficulty breathing or shortness of breath. This is a chronic problem. The current episode started more than 1 year ago. The problem occurs intermittently. The problem has been waxing and waning. Associated symptoms include heartburn and malaise/fatigue. Her past medical history is significant for asthma.      Review of Systems  Constitutional: Positive for irritability and malaise/fatigue.  HENT: Positive for hoarse voice.   Respiratory: Negative for shortness of breath.   Gastrointestinal: Positive for heartburn.  Musculoskeletal: Positive for arthritis, back pain and stiffness.  Psychiatric/Behavioral: Positive for depression. The patient is nervous/anxious.   All other systems reviewed and are negative.  Objective:   Physical Exam  Constitutional: She is oriented to person, place, and time. She appears well-developed  and well-nourished. She is irritable. No distress.  Morbid obese   HENT:  Head: Normocephalic and atraumatic.  Right Ear: External ear normal.  Mouth/Throat: Oropharynx is clear and moist.  Eyes: Pupils are equal, round, and reactive to light.  Neck: Normal range of motion. Neck supple. No thyromegaly present.  Cardiovascular: Normal rate, regular rhythm, normal heart sounds and intact distal pulses.  No murmur heard. Pulmonary/Chest: Effort normal and breath sounds normal. No respiratory distress. She has no wheezes.  Abdominal: Soft. Bowel sounds are normal. She exhibits no distension. There is no tenderness.  Musculoskeletal: She exhibits no edema or tenderness.  Low back pain with flexion or extension  Neurological: She is alert and oriented to person, place, and time.  Skin: Skin is warm and dry.  Psychiatric: She has a normal mood and affect. Her behavior is normal. Judgment and thought content normal.  Vitals reviewed.     BP (!) 154/91   Pulse 60   Temp 98.3 F (36.8 C) (Oral)   Ht '5\' 3"'$  (1.6 m)   Wt 284 lb 12.8 oz (129.2 kg)   BMI 50.45 kg/m      Assessment & Plan:  1. Essential hypertension - CMP14+EGFR  2. Mild intermittent asthma without complication - OYD74+JOIN  3. Primary osteoarthritis involving multiple joints Will increase percocet to every 6 hours as needed from 8 hours as needed - CMP14+EGFR - oxyCODONE-acetaminophen (PERCOCET) 7.5-325 MG tablet; Take 1 tablet by mouth every 6 (six) hours as needed for severe pain.  Dispense: 120 tablet; Refill: 0 - oxyCODONE-acetaminophen (PERCOCET) 7.5-325 MG tablet; Take 1 tablet by mouth every 6 (six) hours as needed for severe pain.  Dispense: 120 tablet; Refill: 0 - oxyCODONE-acetaminophen (PERCOCET) 7.5-325 MG tablet; Take 1 tablet by mouth every 6 (six) hours as needed for severe pain.  Dispense: 120 tablet; Refill: 0  4. Moderate episode of recurrent major depressive disorder (HCC) - CMP14+EGFR -  ALPRAZolam (XANAX) 0.5 MG tablet; Take 1 tablet (0.5 mg total) by mouth at bedtime as needed for anxiety.  Dispense: 30 tablet; Refill: 3  5. GAD (generalized anxiety disorder) - CMP14+EGFR - ALPRAZolam (XANAX) 0.5 MG tablet; Take 1 tablet (0.5 mg total) by mouth at bedtime as needed for anxiety.  Dispense: 30 tablet; Refill: 3  6. Morbid obesity with BMI of 50.0-59.9, adult ([place]) - CMP14+EGFR  7. Hyperlipidemia, unspecified hyperlipidemia type - CMP14+EGFR  8. Chronic bilateral low back pain with right-sided sciatica - CMP14+EGFR - oxyCODONE-acetaminophen (PERCOCET) 7.5-325 MG tablet; Take 1 tablet by mouth every 6 (six) hours as needed for severe pain.  Dispense: 120 tablet; Refill: 0 - oxyCODONE-acetaminophen (PERCOCET) 7.5-325 MG tablet; Take 1 tablet by mouth every 6 (six) hours as needed for severe pain.  Dispense: 120 tablet; Refill: 0 - oxyCODONE-acetaminophen (PERCOCET) 7.5-325 MG tablet; Take 1 tablet by mouth every 6 (six) hours as needed for severe pain.  Dispense: 120 tablet; Refill: 0  9. Pain medication agreement signed - CMP14+EGFR - oxyCODONE-acetaminophen (PERCOCET) 7.5-325 MG tablet; Take 1 tablet by mouth every 6 (six) hours as needed for severe pain.  Dispense: 120 tablet; Refill: 0 - oxyCODONE-acetaminophen (PERCOCET) 7.5-325 MG tablet; Take 1 tablet by mouth every 6 (six) hours as needed for severe pain.  Dispense: 120 tablet; Refill: 0 - oxyCODONE-acetaminophen (PERCOCET) 7.5-325 MG tablet; Take 1 tablet by mouth every 6 (six) hours as needed for severe pain.  Dispense: 120 tablet; Refill: 0  10. Uncomplicated opioid dependence ([place]) - CMP14+EGFR - oxyCODONE-acetaminophen (PERCOCET) 7.5-325 MG tablet; Take 1 tablet by mouth every 6 (six) hours as needed for severe pain.  Dispense: 120 tablet; Refill: 0 - oxyCODONE-acetaminophen (PERCOCET) 7.5-325 MG tablet; Take 1 tablet by mouth every 6 (six) hours as needed for severe pain.  Dispense: 120 tablet; Refill: 0 -  oxyCODONE-acetaminophen (PERCOCET) 7.5-325 MG tablet; Take 1 tablet by mouth every 6 (six) hours as needed for severe pain.  Dispense: 120 tablet; Refill: 0   Pt reviewed in [place] controlled database. Pt has only received controlled medications from me over the last 6 months.   Continue all meds Labs pending Health Maintenance reviewed Diet and exercise encouraged RTO 3 months   Evelina Dun, FNP

## 2017-06-29 LAB — CMP14+EGFR
A/G RATIO: 1.8 (ref 1.2–2.2)
ALT: 14 IU/L (ref 0–32)
AST: 10 IU/L (ref 0–40)
Albumin: 4.2 g/dL (ref 3.6–4.8)
Alkaline Phosphatase: 89 IU/L (ref 39–117)
BUN/Creatinine Ratio: 25 (ref 12–28)
BUN: 31 mg/dL — ABNORMAL HIGH (ref 8–27)
Bilirubin Total: 0.2 mg/dL (ref 0.0–1.2)
CALCIUM: 8.8 mg/dL (ref 8.7–10.3)
CO2: 26 mmol/L (ref 20–29)
CREATININE: 1.26 mg/dL — AB (ref 0.57–1.00)
Chloride: 99 mmol/L (ref 96–106)
GFR, EST AFRICAN AMERICAN: 52 mL/min/{1.73_m2} — AB (ref >59)
GFR, EST NON AFRICAN AMERICAN: 45 mL/min/{1.73_m2} — AB (ref >59)
GLOBULIN, TOTAL: 2.4 g/dL (ref 1.5–4.5)
Glucose: 95 mg/dL (ref 65–99)
POTASSIUM: 4.9 mmol/L (ref 3.5–5.2)
SODIUM: 139 mmol/L (ref 134–144)
TOTAL PROTEIN: 6.6 g/dL (ref 6.0–8.5)

## 2017-07-11 ENCOUNTER — Other Ambulatory Visit: Payer: Self-pay | Admitting: Family

## 2017-07-11 DIAGNOSIS — M150 Primary generalized (osteo)arthritis: Principal | ICD-10-CM

## 2017-07-11 DIAGNOSIS — M159 Polyosteoarthritis, unspecified: Secondary | ICD-10-CM

## 2017-07-12 ENCOUNTER — Encounter: Payer: Self-pay | Admitting: Neurology

## 2017-07-12 ENCOUNTER — Ambulatory Visit: Payer: PPO | Admitting: Neurology

## 2017-07-12 VITALS — BP 122/60 | HR 109 | Ht 63.0 in | Wt 282.0 lb

## 2017-07-12 DIAGNOSIS — H4710 Unspecified papilledema: Secondary | ICD-10-CM

## 2017-07-12 NOTE — Patient Instructions (Signed)
We will test you for increased spinal fluid pressure in your head. 1.  First we will get MRI of brain with and without contrast and MRV of head. 2.  Afterwards, we will schedule you for a spinal tap, measuring opening CSF pressure and checking spinal fluid for cell count with diff, protein, glucose and gram stain and culture. 3.  Follow up in 3 months.

## 2017-07-12 NOTE — Progress Notes (Signed)
NEUROLOGY CONSULTATION NOTE  LOWELLA KINDLEY MRN: [id number] DOB: 1950-09-19  Referring provider: Despina Arias, OD Primary care provider: Jannifer Rodney, FNP  Reason for consult:  papilledema  HISTORY OF PRESENT ILLNESS: Kathleen Saunders is a 67 year old female with osteoarthritis, chronic low back pain, hypertension, morbid obesity, asthma who presents for evaluation of vision changes and headache.  She has been under increased stress over the past year caring for her husband with dementia.  Over the past year, she has had mild-moderate occipital and temporal non-throbbing headache.  It is not associated with nausea, photophobia, phonophobia or unilateral numbness or weakness.  It typically lasts an hour or two with Tylenol.  It occurs once to three days a week.  They are not positional.  On two occasions, she woke up with pounding headache.  She also noticed blurred vision.  She denies visual obscurations or pulsatile tinnitus.  She has no prior history of headache.  She saw her optometrist in October.  Notes aren't available, but she was diagnosed with papilledema.  She was on prednisone at the time to treat bronchitis.  Her granddaughter was diagnosed with idiopathic intracranial hypertension.  PAST MEDICAL HISTORY: Past Medical History:  Diagnosis Date  . Hyperlipidemia     PAST SURGICAL HISTORY: Past Surgical History:  Procedure Laterality Date  . ABDOMINAL HYSTERECTOMY    . CARPAL TUNNEL RELEASE    . CHOLECYSTECTOMY    . GASTRIC BYPASS    . PLANTAR FASCIA RELEASE Right   . REPLACEMENT TOTAL KNEE Right     MEDICATIONS: Current Outpatient Medications on File Prior to Visit  Medication Sig Dispense Refill  . ALPRAZolam (XANAX) 0.5 MG tablet Take 1 tablet (0.5 mg total) by mouth at bedtime as needed for anxiety. 30 tablet 3  . lisinopril-hydrochlorothiazide (PRINZIDE,ZESTORETIC) 20-25 MG tablet TAKE 1 TABLET BY MOUTH DAILY 90 tablet 0  . methocarbamol (ROBAXIN) 500 MG tablet TAKE 1  TABLET(500 MG) BY MOUTH EVERY 6 HOURS AS NEEDED FOR MUSCLE SPASMS 120 tablet 0  . oxyCODONE-acetaminophen (PERCOCET) 7.5-325 MG tablet Take 1 tablet by mouth every 6 (six) hours as needed for severe pain. 120 tablet 0  . oxyCODONE-acetaminophen (PERCOCET) 7.5-325 MG tablet Take 1 tablet by mouth every 6 (six) hours as needed for severe pain. 120 tablet 0  . oxyCODONE-acetaminophen (PERCOCET) 7.5-325 MG tablet Take 1 tablet by mouth every 6 (six) hours as needed for severe pain. 120 tablet 0  . pantoprazole (PROTONIX) 40 MG tablet TAKE 1 TABLET(40 MG) BY MOUTH DAILY 90 tablet 0  . sertraline (ZOLOFT) 50 MG tablet Take 1 tablet (50 mg total) by mouth daily. 90 tablet 3   No current facility-administered medications on file prior to visit.     ALLERGIES: Allergies  Allergen Reactions  . Simvastatin Other (See Comments)    Body aches    FAMILY HISTORY: Family History  Problem Relation Age of Onset  . Diabetes Mother   . Heart disease Father   . Heart attack Father        Died at 38 MI    SOCIAL HISTORY: Social History   Socioeconomic History  . Marital status: Married    Spouse name: Jomarie Longs  . Number of children: 3  . Years of education: Not on file  . Highest education level: High school graduate  Social Needs  . Financial resource strain: Not on file  . Food insecurity - worry: Not on file  . Food insecurity - inability: Not on file  .  Transportation needs - medical: Not on file  . Transportation needs - non-medical: Not on file  Occupational History  . Occupation: Stylist    Comment: self employeed  Tobacco Use  . Smoking status: Former Games developermoker  . Smokeless tobacco: Never Used  Substance and Sexual Activity  . Alcohol use: No  . Drug use: No  . Sexual activity: Not on file  Other Topics Concern  . Not on file  Social History Narrative   Patient is right-handed, married lives with husband in a one story house. She ddrinks 1-2 glasses of tea a day, an occasional soda  and rarely coffee. She is limited in her activity due to arthritis. She has a Engineer, drillingcosmetology license.    REVIEW OF SYSTEMS: Constitutional: No fevers, chills, or sweats, no generalized fatigue, change in appetite Eyes: No visual changes, double vision, eye pain Ear, nose and throat: No hearing loss, ear pain, nasal congestion, sore throat Cardiovascular: No chest pain, palpitations Respiratory:  No shortness of breath at rest or with exertion, wheezes GastrointestinaI: No nausea, vomiting, diarrhea, abdominal pain, fecal incontinence Genitourinary:  No dysuria, urinary retention or frequency Musculoskeletal:  No neck pain, back pain Integumentary: No rash, pruritus, skin lesions Neurological: as above Psychiatric: No depression, insomnia, anxiety Endocrine: No palpitations, fatigue, diaphoresis, mood swings, change in appetite, change in weight, increased thirst Hematologic/Lymphatic:  No purpura, petechiae. Allergic/Immunologic: no itchy/runny eyes, nasal congestion, recent allergic reactions, rashes  PHYSICAL EXAM: Vitals:   07/12/17 0819  BP: 122/60  Pulse: (!) 109  SpO2: 92%   General: No acute distress.  Patient appears well-groomed.  Morbidly obese Head:  Normocephalic/atraumatic Eyes:  fundi examined but not visualized Neck: supple, no paraspinal tenderness, full range of motion Back: No paraspinal tenderness Heart: regular rate and rhythm Lungs: Clear to auscultation bilaterally. Vascular: No carotid bruits. Neurological Exam: Mental status: alert and oriented to person, place, and time, recent and remote memory intact, fund of knowledge intact, attention and concentration intact, speech fluent and not dysarthric, language intact. Cranial nerves: CN I: not tested CN II: pupils equal, round and reactive to light, visual fields intact CN III, IV, VI:  full range of motion, no nystagmus, no ptosis CN V: facial sensation intact CN VII: upper and lower face symmetric CN  VIII: hearing intact CN IX, X: gag intact, uvula midline CN XI: sternocleidomastoid and trapezius muscles intact CN XII: tongue midline Bulk & Tone: normal, no fasciculations. Motor:  5/5 throughout  Sensation: temperature and vibration sensation intact. Deep Tendon Reflexes:  2+ throughout, toes downgoing.  Finger to nose testing:  Fine postural and kinetic tremor in both hands.  Without dysmetria.  Heel to shin:  Without dysmetria.  Gait:  Mildly wide-based..  Able to turn and tandem walk. Romberg negative.  IMPRESSION: Papilledema.  Consider idiopathic intracranial hypertension. Morbid obesity.  PLAN: 1.  Check MRI of brain with and without contrast and MRV of head to evaluate for secondary etiologies (mass lesion, venous sinus thrombosis) 2.  After MRI, will schedule for lumbar puncture to measure opening pressure and test CSF for cell count with diff, protein, glucose and gram stain and culture. 3.  Once diagnosis is confirmed, start acetazolamide 500mg  twice daily. 4.  After starting acetazolamide, I want her to follow up with her optometrist in 6 weeks for recheck. 5.  Advised to lose weight 6.  Follow up in 3 months.  Thank you for allowing me to take part in the care of this patient.  45 minutes  spent face to face with patient, over 50% spent discussing differential diagnosis, workup and probable management.  Shon Millet, DO  CC:  Jannifer Rodney, FNP  Despina Arias, OD

## 2017-07-14 ENCOUNTER — Other Ambulatory Visit: Payer: Self-pay | Admitting: Family

## 2017-07-14 ENCOUNTER — Telehealth: Payer: Self-pay | Admitting: Family

## 2017-07-14 DIAGNOSIS — R062 Wheezing: Secondary | ICD-10-CM

## 2017-07-14 DIAGNOSIS — J410 Simple chronic bronchitis: Secondary | ICD-10-CM | POA: Diagnosis not present

## 2017-07-14 MED ORDER — DOXYCYCLINE HYCLATE 100 MG PO TABS: 100.0000 mg | ORAL_TABLET | Freq: Two times a day (BID) | ORAL | 0 refills | Status: DC

## 2017-07-14 MED ORDER — PREDNISONE 10 MG (21) PO TBPK: ORAL_TABLET | ORAL | 0 refills | Status: DC

## 2017-07-14 MED ORDER — ALBUTEROL SULFATE (2.5 MG/3ML) 0.083% IN NEBU: 2.5000 mg | INHALATION_SOLUTION | Freq: Four times a day (QID) | RESPIRATORY_TRACT | 1 refills | Status: AC | PRN

## 2017-07-14 NOTE — Telephone Encounter (Signed)
Sent in doxycycline, prednisone, and albuterol

## 2017-07-14 NOTE — Telephone Encounter (Signed)
Pt states bad sore throat, chest congestion, wheezing Wants to know if we can call her in one of the machines for breathing treatments Robbie Lis( apothecary in [place])  And she also is wanting to have something called in for what all is going on she wants that sent to [facility] [place]

## 2017-07-16 ENCOUNTER — Other Ambulatory Visit: Payer: Self-pay | Admitting: Family

## 2017-07-16 MED ORDER — CLOTRIMAZOLE 10 MG MT TROC: 10.0000 mg | Freq: Every day | OROMUCOSAL | 1 refills | Status: DC

## 2017-07-16 NOTE — Progress Notes (Signed)
Pt complains of oral thrush. Will send in Mycelex.

## 2017-07-18 ENCOUNTER — Other Ambulatory Visit: Payer: PPO

## 2017-07-23 ENCOUNTER — Other Ambulatory Visit: Payer: Self-pay | Admitting: Family

## 2017-07-23 DIAGNOSIS — F331 Major depressive disorder, recurrent, moderate: Secondary | ICD-10-CM

## 2017-07-23 DIAGNOSIS — F411 Generalized anxiety disorder: Secondary | ICD-10-CM

## 2017-07-23 MED ORDER — ALPRAZOLAM 0.5 MG PO TABS: 0.5000 mg | ORAL_TABLET | Freq: Every evening | ORAL | 3 refills | Status: DC | PRN

## 2017-08-02 ENCOUNTER — Other Ambulatory Visit: Payer: Self-pay | Admitting: Family

## 2017-08-02 MED ORDER — FUROSEMIDE 20 MG PO TABS: 20.0000 mg | ORAL_TABLET | Freq: Every day | ORAL | 11 refills | Status: DC

## 2017-08-02 NOTE — Progress Notes (Signed)
Pt complaining of increased peripheral edema. Will send in Lasix 20 mg today. Low salt diet.

## 2017-08-11 DIAGNOSIS — J410 Simple chronic bronchitis: Secondary | ICD-10-CM | POA: Diagnosis not present

## 2017-08-16 ENCOUNTER — Other Ambulatory Visit: Payer: Self-pay | Admitting: Family

## 2017-08-16 MED ORDER — ESOMEPRAZOLE MAGNESIUM 20 MG PO CPDR: 20.0000 mg | DELAYED_RELEASE_CAPSULE | Freq: Every day | ORAL | 1 refills | Status: DC

## 2017-08-24 ENCOUNTER — Other Ambulatory Visit: Payer: Self-pay | Admitting: Family Medicine

## 2017-08-24 DIAGNOSIS — M159 Polyosteoarthritis, unspecified: Secondary | ICD-10-CM

## 2017-08-24 DIAGNOSIS — M150 Primary generalized (osteo)arthritis: Principal | ICD-10-CM

## 2017-08-29 ENCOUNTER — Other Ambulatory Visit: Payer: Self-pay | Admitting: Family

## 2017-08-29 DIAGNOSIS — I10 Essential (primary) hypertension: Secondary | ICD-10-CM

## 2017-09-06 ENCOUNTER — Other Ambulatory Visit: Payer: Self-pay | Admitting: Family

## 2017-09-06 MED ORDER — NALOXEGOL OXALATE 25 MG PO TABS: 25.0000 mg | ORAL_TABLET | Freq: Every day | ORAL | 3 refills | Status: DC

## 2017-09-07 ENCOUNTER — Telehealth: Payer: Self-pay

## 2017-09-07 MED ORDER — OSELTAMIVIR PHOSPHATE 75 MG PO CAPS: 75.0000 mg | ORAL_CAPSULE | Freq: Two times a day (BID) | ORAL | 0 refills | Status: DC

## 2017-09-07 NOTE — Telephone Encounter (Signed)
I sent Tamiflu for the patient 

## 2017-09-07 NOTE — Telephone Encounter (Signed)
Last night patient began having high fever and body aches, she has had an exposure to the flu.  She is unable to come in because she is at home with her husband who has dementia and she is unable to bring him in with her.  This is Kathleen Saunders' mom.  She uses [facility] in [place].  Please advise.

## 2017-09-07 NOTE — Telephone Encounter (Signed)
Left message, script is ready.

## 2017-09-11 DIAGNOSIS — J410 Simple chronic bronchitis: Secondary | ICD-10-CM | POA: Diagnosis not present

## 2017-09-27 ENCOUNTER — Other Ambulatory Visit: Payer: Self-pay | Admitting: Family

## 2017-09-27 ENCOUNTER — Telehealth: Payer: Self-pay | Admitting: Family

## 2017-09-27 DIAGNOSIS — M5441 Lumbago with sciatica, right side: Secondary | ICD-10-CM

## 2017-09-27 DIAGNOSIS — Z0289 Encounter for other administrative examinations: Secondary | ICD-10-CM

## 2017-09-27 DIAGNOSIS — M159 Polyosteoarthritis, unspecified: Secondary | ICD-10-CM

## 2017-09-27 DIAGNOSIS — M150 Primary generalized (osteo)arthritis: Principal | ICD-10-CM

## 2017-09-27 DIAGNOSIS — G8929 Other chronic pain: Secondary | ICD-10-CM

## 2017-09-27 DIAGNOSIS — F1120 Opioid dependence, uncomplicated: Secondary | ICD-10-CM

## 2017-09-27 MED ORDER — OXYCODONE-ACETAMINOPHEN 7.5-325 MG PO TABS: 1.0000 | ORAL_TABLET | Freq: Four times a day (QID) | ORAL | 0 refills | Status: DC | PRN

## 2017-09-27 NOTE — Telephone Encounter (Signed)
Prescription sent to pharmacy.

## 2017-10-11 DIAGNOSIS — J410 Simple chronic bronchitis: Secondary | ICD-10-CM | POA: Diagnosis not present

## 2017-10-17 ENCOUNTER — Telehealth: Payer: Self-pay | Admitting: Neurology

## 2017-10-17 NOTE — Telephone Encounter (Signed)
Spoke with patient. Made aware order still in system. Gave her the number to [facility] for her to call and reschedule.

## 2017-10-17 NOTE — Telephone Encounter (Signed)
Patient had to cancel her follow up with Dr. Everlena Cooper for 10/19/17 due to her not having her MRI yet. Her husband has been in the hospital and she has not been able to have it done. She would like to have the MRI rescheduled. Can a new order be sent in for her MRI? Please Call. Thanks

## 2017-10-19 ENCOUNTER — Ambulatory Visit: Payer: PPO | Admitting: Neurology

## 2017-10-24 ENCOUNTER — Other Ambulatory Visit: Payer: Self-pay | Admitting: Family

## 2017-10-24 DIAGNOSIS — M150 Primary generalized (osteo)arthritis: Principal | ICD-10-CM

## 2017-10-24 DIAGNOSIS — M159 Polyosteoarthritis, unspecified: Secondary | ICD-10-CM

## 2017-10-25 ENCOUNTER — Telehealth: Payer: Self-pay | Admitting: Family

## 2017-10-25 NOTE — Telephone Encounter (Signed)
Patient NTBS for follow up and lab work  

## 2017-10-25 NOTE — Telephone Encounter (Signed)
What is the name of the medication? oxycodone  Have you contacted your pharmacy to request a refill? Yes today  Which pharmacy would you like this sent to? [facility] in [place]   Patient notified that their request is being sent to the clinical staff for review and that they should receive a call once it is complete. If they do not receive a call within 24 hours they can check with their pharmacy or our office.

## 2017-10-25 NOTE — Telephone Encounter (Signed)
Please advise on refill. Patient due for appointment?

## 2017-10-25 NOTE — Telephone Encounter (Signed)
lmtcb

## 2017-10-27 ENCOUNTER — Ambulatory Visit: Payer: PPO | Admitting: Family

## 2017-10-28 ENCOUNTER — Other Ambulatory Visit: Payer: Self-pay | Admitting: Nurse Practitioner

## 2017-10-28 DIAGNOSIS — G8929 Other chronic pain: Secondary | ICD-10-CM

## 2017-10-28 DIAGNOSIS — M159 Polyosteoarthritis, unspecified: Secondary | ICD-10-CM

## 2017-10-28 DIAGNOSIS — F1120 Opioid dependence, uncomplicated: Secondary | ICD-10-CM

## 2017-10-28 DIAGNOSIS — M150 Primary generalized (osteo)arthritis: Principal | ICD-10-CM

## 2017-10-28 DIAGNOSIS — M5441 Lumbago with sciatica, right side: Secondary | ICD-10-CM

## 2017-10-28 DIAGNOSIS — Z0289 Encounter for other administrative examinations: Secondary | ICD-10-CM

## 2017-10-28 MED ORDER — OXYCODONE-ACETAMINOPHEN 7.5-325 MG PO TABS: 1.0000 | ORAL_TABLET | Freq: Four times a day (QID) | ORAL | 0 refills | Status: DC | PRN

## 2017-10-28 NOTE — Addendum Note (Signed)
Addended by: Elenora Gamma on: 10/28/2017 06:07 PM   Modules accepted: Orders

## 2017-10-28 NOTE — Addendum Note (Signed)
Addended by: Tamera Punt on: 10/28/2017 05:05 PM   Modules accepted: Orders

## 2017-10-28 NOTE — Progress Notes (Signed)
Oxycodone, this was refilled by back-up coverage yesterday however had a 30-day no refill modification by mistake.  Has not had a refill since 4/23.  Refilled chronic narcotic.   Murtis Sink, MD [facility] 10/28/2017, 6:07 PM

## 2017-11-02 NOTE — Telephone Encounter (Signed)
Patient scheduled an appointment but had to cancel.  One refill was given by provider.

## 2017-11-11 DIAGNOSIS — J410 Simple chronic bronchitis: Secondary | ICD-10-CM | POA: Diagnosis not present

## 2017-11-15 ENCOUNTER — Encounter: Payer: Self-pay | Admitting: Family

## 2017-11-15 ENCOUNTER — Telehealth: Payer: Self-pay | Admitting: Neurology

## 2017-11-15 ENCOUNTER — Ambulatory Visit: Payer: PPO | Admitting: Family

## 2017-11-15 VITALS — BP 138/58 | HR 57 | Temp 100.0°F | Ht 63.0 in | Wt 282.4 lb

## 2017-11-15 DIAGNOSIS — M150 Primary generalized (osteo)arthritis: Secondary | ICD-10-CM | POA: Diagnosis not present

## 2017-11-15 DIAGNOSIS — G4733 Obstructive sleep apnea (adult) (pediatric): Secondary | ICD-10-CM

## 2017-11-15 DIAGNOSIS — Z9989 Dependence on other enabling machines and devices: Secondary | ICD-10-CM | POA: Diagnosis not present

## 2017-11-15 DIAGNOSIS — F411 Generalized anxiety disorder: Secondary | ICD-10-CM

## 2017-11-15 DIAGNOSIS — E559 Vitamin D deficiency, unspecified: Secondary | ICD-10-CM

## 2017-11-15 DIAGNOSIS — M5441 Lumbago with sciatica, right side: Secondary | ICD-10-CM | POA: Diagnosis not present

## 2017-11-15 DIAGNOSIS — E785 Hyperlipidemia, unspecified: Secondary | ICD-10-CM

## 2017-11-15 DIAGNOSIS — F1120 Opioid dependence, uncomplicated: Secondary | ICD-10-CM

## 2017-11-15 DIAGNOSIS — Z0289 Encounter for other administrative examinations: Secondary | ICD-10-CM

## 2017-11-15 DIAGNOSIS — Z1212 Encounter for screening for malignant neoplasm of rectum: Secondary | ICD-10-CM

## 2017-11-15 DIAGNOSIS — F331 Major depressive disorder, recurrent, moderate: Secondary | ICD-10-CM

## 2017-11-15 DIAGNOSIS — M159 Polyosteoarthritis, unspecified: Secondary | ICD-10-CM

## 2017-11-15 DIAGNOSIS — J4520 Mild intermittent asthma, uncomplicated: Secondary | ICD-10-CM | POA: Diagnosis not present

## 2017-11-15 DIAGNOSIS — G8929 Other chronic pain: Secondary | ICD-10-CM

## 2017-11-15 DIAGNOSIS — Z1211 Encounter for screening for malignant neoplasm of colon: Secondary | ICD-10-CM

## 2017-11-15 DIAGNOSIS — I10 Essential (primary) hypertension: Secondary | ICD-10-CM | POA: Diagnosis not present

## 2017-11-15 DIAGNOSIS — Z6841 Body Mass Index (BMI) 40.0 and over, adult: Secondary | ICD-10-CM

## 2017-11-15 MED ORDER — OXYCODONE-ACETAMINOPHEN 7.5-325 MG PO TABS: 1.0000 | ORAL_TABLET | Freq: Four times a day (QID) | ORAL | 0 refills | Status: DC | PRN

## 2017-11-15 NOTE — Addendum Note (Signed)
Addended by: Jannifer RodneyHAWKS, Mykah Bellomo A on: 11/15/2017 11:59 AM   Modules accepted: Orders

## 2017-11-15 NOTE — Progress Notes (Signed)
Subjective:    Patient ID: Kathleen Saunders, female    DOB: Aug 13, 1950, 67 y.o.   MRN: [id number]  Chief Complaint  Patient presents with  . Medical Management of Chronic Issues    three month recheck   Pt presents to the office today for chronic follow up. Pt is under a great deal of stress with caring for her husband.  Hypertension  This is a chronic problem. The current episode started more than 1 year ago. The problem has been resolved since onset. The problem is controlled. Associated symptoms include anxiety. Pertinent negatives include no malaise/fatigue, peripheral edema or shortness of breath. Risk factors for coronary artery disease include dyslipidemia, diabetes mellitus, obesity and sedentary lifestyle. The current treatment provides moderate improvement. There is no history of kidney disease, CAD/MI or heart failure.  Hyperlipidemia  This is a chronic problem. The current episode started more than 1 year ago. The problem is uncontrolled. Recent lipid tests were reviewed and are high. Exacerbating diseases include obesity. Pertinent negatives include no shortness of breath. Current antihyperlipidemic treatment includes diet change. The current treatment provides no improvement of lipids.  Depression         This is a chronic problem.  The current episode started more than 1 year ago.   The onset quality is gradual.   The problem occurs intermittently.  The problem has been waxing and waning since onset.  Associated symptoms include irritable, restlessness and sad.  Associated symptoms include no decreased concentration, no helplessness and no hopelessness.  Past treatments include SSRIs - Selective serotonin reuptake inhibitors.  Past medical history includes anxiety.   Anxiety  Presents for follow-up visit. Symptoms include excessive worry, irritability, nervous/anxious behavior and restlessness. Patient reports no decreased concentration or shortness of breath. Symptoms occur most  days. The quality of sleep is good.    Arthritis  Presents for follow-up visit. She complains of pain and stiffness. The symptoms have been stable. Affected locations include the right knee and left knee (back). Her pain is at a severity of 10/10 (when walk 6-10).  OSA PT states she is not using CPAP nightly and having increase fatigued.    Review of Systems  Constitutional: Positive for irritability. Negative for malaise/fatigue.  Respiratory: Negative for shortness of breath.   Musculoskeletal: Positive for arthritis and stiffness.  Psychiatric/Behavioral: Positive for depression. Negative for decreased concentration. The patient is nervous/anxious.   All other systems reviewed and are negative.      Objective:   Physical Exam  Constitutional: She is oriented to person, place, and time. She appears well-developed and well-nourished. She is irritable. No distress.  HENT:  Head: Normocephalic and atraumatic.  Right Ear: External ear normal.  Left Ear: External ear normal.  Mouth/Throat: Oropharynx is clear and moist.  Eyes: Pupils are equal, round, and reactive to light.  Neck: Normal range of motion. Neck supple. No thyromegaly present.  Cardiovascular: Normal rate, regular rhythm, normal heart sounds and intact distal pulses.  No murmur heard. Pulmonary/Chest: Effort normal and breath sounds normal. No respiratory distress. She has no wheezes.  Abdominal: Soft. Bowel sounds are normal. She exhibits no distension. There is no tenderness.  Musculoskeletal: Normal range of motion. She exhibits no edema or tenderness.  Neurological: She is alert and oriented to person, place, and time. She has normal reflexes. No cranial nerve deficit.  Skin: Skin is warm and dry.  Psychiatric: She has a normal mood and affect. Her behavior is normal. Judgment and thought  content normal.  Vitals reviewed.     BP (!) 138/58   Pulse (!) 57   Temp 100 F (37.8 C) (Oral) Comment: Drinking  coffee.  Ht 5' 3"  (1.6 m)   Wt 282 lb 6.4 oz (128.1 kg)   BMI 50.02 kg/m      Assessment & Plan:  Kathleen Saunders comes in today with chief complaint of Medical Management of Chronic Issues (three month recheck)   Diagnosis and orders addressed:  1. Essential hypertension - CMP14+EGFR - CBC with Differential/Platelet  2. Mild intermittent asthma without complication - PRF16+BWGY - CBC with Differential/Platelet  3. OSA on CPAP - CMP14+EGFR - CBC with Differential/Platelet  4. Primary osteoarthritis involving multiple joints - CMP14+EGFR - CBC with Differential/Platelet - oxyCODONE-acetaminophen (PERCOCET) 7.5-325 MG tablet; Take 1 tablet by mouth every 6 (six) hours as needed for severe pain.  Dispense: 120 tablet; Refill: 0 - oxyCODONE-acetaminophen (PERCOCET) 7.5-325 MG tablet; Take 1 tablet by mouth every 6 (six) hours as needed for severe pain.  Dispense: 120 tablet; Refill: 0 - oxyCODONE-acetaminophen (PERCOCET) 7.5-325 MG tablet; Take 1 tablet by mouth every 6 (six) hours as needed for severe pain.  Dispense: 120 tablet; Refill: 0  5. Chronic bilateral low back pain with right-sided sciatica - CMP14+EGFR - CBC with Differential/Platelet - oxyCODONE-acetaminophen (PERCOCET) 7.5-325 MG tablet; Take 1 tablet by mouth every 6 (six) hours as needed for severe pain.  Dispense: 120 tablet; Refill: 0 - oxyCODONE-acetaminophen (PERCOCET) 7.5-325 MG tablet; Take 1 tablet by mouth every 6 (six) hours as needed for severe pain.  Dispense: 120 tablet; Refill: 0 - oxyCODONE-acetaminophen (PERCOCET) 7.5-325 MG tablet; Take 1 tablet by mouth every 6 (six) hours as needed for severe pain.  Dispense: 120 tablet; Refill: 0  6. Vitamin D deficiency - CMP14+EGFR - CBC with Differential/Platelet - VITAMIN D 25 Hydroxy (Vit-D Deficiency, Fractures)  7. Pain medication agreement signed - CMP14+EGFR - CBC with Differential/Platelet - oxyCODONE-acetaminophen (PERCOCET) 7.5-325 MG tablet;  Take 1 tablet by mouth every 6 (six) hours as needed for severe pain.  Dispense: 120 tablet; Refill: 0 - oxyCODONE-acetaminophen (PERCOCET) 7.5-325 MG tablet; Take 1 tablet by mouth every 6 (six) hours as needed for severe pain.  Dispense: 120 tablet; Refill: 0 - oxyCODONE-acetaminophen (PERCOCET) 7.5-325 MG tablet; Take 1 tablet by mouth every 6 (six) hours as needed for severe pain.  Dispense: 120 tablet; Refill: 0  8. Uncomplicated opioid dependence ([place]) - CMP14+EGFR - CBC with Differential/Platelet - oxyCODONE-acetaminophen (PERCOCET) 7.5-325 MG tablet; Take 1 tablet by mouth every 6 (six) hours as needed for severe pain.  Dispense: 120 tablet; Refill: 0 - oxyCODONE-acetaminophen (PERCOCET) 7.5-325 MG tablet; Take 1 tablet by mouth every 6 (six) hours as needed for severe pain.  Dispense: 120 tablet; Refill: 0 - oxyCODONE-acetaminophen (PERCOCET) 7.5-325 MG tablet; Take 1 tablet by mouth every 6 (six) hours as needed for severe pain.  Dispense: 120 tablet; Refill: 0  9. Hyperlipidemia, unspecified hyperlipidemia type - CMP14+EGFR - CBC with Differential/Platelet - Lipid panel  10. Morbid obesity with BMI of 50.0-59.9, adult (HCC) - CMP14+EGFR - CBC with Differential/Platelet  11. Moderate episode of recurrent major depressive disorder (HCC) - CMP14+EGFR - CBC with Differential/Platelet  12. GAD (generalized anxiety disorder) - CMP14+EGFR - CBC with Differential/Platelet  13. Colon cancer screening  - Cologuard  14. Screening for malignant neoplasm of the rectum  - Cologuard   Labs pending Health Maintenance reviewed Diet and exercise encouraged  Follow up plan: 3 months  Evelina Dun, FNP

## 2017-11-15 NOTE — Patient Instructions (Addendum)

## 2017-11-15 NOTE — Telephone Encounter (Signed)
Patient Kathleen Saunders needing to speak with you regarding an MRI? Please Call. Thanks

## 2017-11-16 LAB — CBC WITH DIFFERENTIAL/PLATELET
BASOS: 1 %
Basophils Absolute: 0.1 10*3/uL (ref 0.0–0.2)
EOS (ABSOLUTE): 0.4 10*3/uL (ref 0.0–0.4)
Eos: 3 %
Hematocrit: 30.5 % — ABNORMAL LOW (ref 34.0–46.6)
Hemoglobin: 9.4 g/dL — ABNORMAL LOW (ref 11.1–15.9)
IMMATURE GRANS (ABS): 0.1 10*3/uL (ref 0.0–0.1)
Immature Granulocytes: 1 %
LYMPHS: 21 %
Lymphocytes Absolute: 2.5 10*3/uL (ref 0.7–3.1)
MCH: 26 pg — ABNORMAL LOW (ref 26.6–33.0)
MCHC: 30.8 g/dL — ABNORMAL LOW (ref 31.5–35.7)
MCV: 85 fL (ref 79–97)
MONOS ABS: 0.7 10*3/uL (ref 0.1–0.9)
Monocytes: 6 %
Neutrophils Absolute: 8.1 10*3/uL — ABNORMAL HIGH (ref 1.4–7.0)
Neutrophils: 68 %
PLATELETS: 421 10*3/uL (ref 150–450)
RBC: 3.61 x10E6/uL — ABNORMAL LOW (ref 3.77–5.28)
RDW: 15.7 % — AB (ref 12.3–15.4)
WBC: 11.8 10*3/uL — ABNORMAL HIGH (ref 3.4–10.8)

## 2017-11-16 LAB — CMP14+EGFR
A/G RATIO: 1.8 (ref 1.2–2.2)
ALK PHOS: 103 IU/L (ref 39–117)
ALT: 11 IU/L (ref 0–32)
AST: 13 IU/L (ref 0–40)
Albumin: 4.3 g/dL (ref 3.6–4.8)
BUN/Creatinine Ratio: 26 (ref 12–28)
BUN: 27 mg/dL (ref 8–27)
CALCIUM: 9.7 mg/dL (ref 8.7–10.3)
CHLORIDE: 100 mmol/L (ref 96–106)
CO2: 27 mmol/L (ref 20–29)
Creatinine, Ser: 1.02 mg/dL — ABNORMAL HIGH (ref 0.57–1.00)
GFR calc Af Amer: 67 mL/min/{1.73_m2} (ref >59)
GFR calc non Af Amer: 58 mL/min/{1.73_m2} — ABNORMAL LOW (ref >59)
Globulin, Total: 2.4 g/dL (ref 1.5–4.5)
Glucose: 116 mg/dL — ABNORMAL HIGH (ref 65–99)
Potassium: 5 mmol/L (ref 3.5–5.2)
SODIUM: 141 mmol/L (ref 134–144)
Total Protein: 6.7 g/dL (ref 6.0–8.5)

## 2017-11-16 LAB — LIPID PANEL
CHOLESTEROL TOTAL: 263 mg/dL — AB (ref 100–199)
Chol/HDL Ratio: 4.5 ratio — ABNORMAL HIGH (ref 0.0–4.4)
HDL: 59 mg/dL (ref >39)
LDL Calculated: 165 mg/dL — ABNORMAL HIGH (ref 0–99)
Triglycerides: 194 mg/dL — ABNORMAL HIGH (ref 0–149)
VLDL CHOLESTEROL CAL: 39 mg/dL (ref 5–40)

## 2017-11-16 LAB — VITAMIN D 25 HYDROXY (VIT D DEFICIENCY, FRACTURES): Vit D, 25-Hydroxy: 9.6 ng/mL — ABNORMAL LOW (ref 30.0–100.0)

## 2017-11-16 NOTE — Telephone Encounter (Signed)
Called Pt, LMOVM for her to return my call. [facility] as [facility].

## 2017-11-17 ENCOUNTER — Other Ambulatory Visit: Payer: Self-pay | Admitting: Family

## 2017-11-17 MED ORDER — VITAMIN D (ERGOCALCIFEROL) 1.25 MG (50000 UNIT) PO CAPS: 50000.0000 [IU] | ORAL_CAPSULE | ORAL | 3 refills | Status: DC

## 2017-11-19 LAB — VITAMIN B12: VITAMIN B 12: 324 pg/mL (ref 232–1245)

## 2017-11-19 LAB — IRON: Iron: 32 ug/dL (ref 27–139)

## 2017-11-19 LAB — SPECIMEN STATUS REPORT

## 2017-11-20 ENCOUNTER — Ambulatory Visit
Admission: RE | Admit: 2017-11-20 | Discharge: 2017-11-20 | Disposition: A | Discharge status: Home / Self Care | Payer: PPO | Source: Ambulatory Visit | Attending: Neurology | Admitting: Neurology

## 2017-11-20 ENCOUNTER — Inpatient Hospital Stay: Admission: RE | Admit: 2017-11-20 | Payer: PPO | Source: Ambulatory Visit

## 2017-11-20 DIAGNOSIS — H4710 Unspecified papilledema: Secondary | ICD-10-CM

## 2017-11-20 MED ORDER — GADOBENATE DIMEGLUMINE 529 MG/ML IV SOLN
20.0000 mL | Freq: Once | INTRAVENOUS | Status: AC | PRN
  Administered 2017-11-20: 20 mL via INTRAVENOUS

## 2017-11-21 DIAGNOSIS — G4733 Obstructive sleep apnea (adult) (pediatric): Secondary | ICD-10-CM | POA: Diagnosis not present

## 2017-11-22 ENCOUNTER — Telehealth: Payer: Self-pay

## 2017-11-22 NOTE — Telephone Encounter (Signed)
Called and LMOVM for Pt to return call 

## 2017-11-22 NOTE — Telephone Encounter (Signed)
-----   Message from Octaviano Battyebecca S Tat, DO sent at 11/21/2017  7:47 AM EDT ----- Let pt know that MRV of the brain looked good

## 2017-11-25 ENCOUNTER — Telehealth: Payer: Self-pay

## 2017-11-25 NOTE — Telephone Encounter (Signed)
-----   Message from Octaviano Battyebecca S Tat, DO sent at 11/21/2017  7:47 AM EDT ----- Let pt know that MRV of the brain looked good

## 2017-11-25 NOTE — Telephone Encounter (Signed)
Called and spoke with Pt, advised her of MRV and MRI results (Dr Tat verbally advised me the MRI was ok other than some mild ischemic changes).

## 2017-11-28 ENCOUNTER — Other Ambulatory Visit: Payer: Self-pay | Admitting: Family

## 2017-11-28 DIAGNOSIS — M159 Polyosteoarthritis, unspecified: Secondary | ICD-10-CM

## 2017-11-28 DIAGNOSIS — M150 Primary generalized (osteo)arthritis: Principal | ICD-10-CM

## 2017-11-29 NOTE — Telephone Encounter (Signed)
Last seen 11/15/17

## 2017-12-01 ENCOUNTER — Other Ambulatory Visit: Payer: Self-pay | Admitting: Family

## 2017-12-01 DIAGNOSIS — I10 Essential (primary) hypertension: Secondary | ICD-10-CM

## 2017-12-02 ENCOUNTER — Telehealth: Payer: Self-pay

## 2017-12-02 DIAGNOSIS — H4710 Unspecified papilledema: Secondary | ICD-10-CM

## 2017-12-02 NOTE — Telephone Encounter (Signed)
-----   Message from Drema DallasAdam R Jaffe, DO sent at 11/30/2017  8:25 PM EDT ----- The next step following the MRI was to set up for a lumbar puncture to measure opening pressure and check spinal fluid for cell count with diff, protein, glucose, gram stain and culture.

## 2017-12-02 NOTE — Telephone Encounter (Signed)
Called Pt and advised we are sending orders to [facility] for LP.  Called [facility], spoke with Jenel LucksRoberta to advise of order, she will call Pt to schedule.

## 2017-12-11 DIAGNOSIS — J410 Simple chronic bronchitis: Secondary | ICD-10-CM | POA: Diagnosis not present

## 2017-12-12 ENCOUNTER — Ambulatory Visit
Admission: RE | Admit: 2017-12-12 | Discharge: 2017-12-12 | Disposition: A | Discharge status: Home / Self Care | Payer: PPO | Source: Ambulatory Visit | Attending: Neurology | Admitting: Neurology

## 2017-12-12 DIAGNOSIS — H4710 Unspecified papilledema: Secondary | ICD-10-CM | POA: Diagnosis not present

## 2017-12-12 NOTE — Discharge Instructions (Signed)

## 2017-12-13 ENCOUNTER — Telehealth: Payer: Self-pay

## 2017-12-13 NOTE — Telephone Encounter (Signed)
-----   Message from Drema DallasAdam R Jaffe, DO sent at 12/13/2017  4:42 PM EDT ----- Spinal tap shows normal pressure.  MRI of brain is unremarkable.  I have no explanation for why the eye doctor saw swelling of the optic discs.  If she is willing, we can send her to another ophthalmologist for second opinion Wynell Balloon([facility] at [facility])

## 2017-12-13 NOTE — Telephone Encounter (Signed)
Called and LMOVM asking Pt to return my call 

## 2017-12-14 NOTE — Telephone Encounter (Signed)
Pt rtrnd call. Advised her of results and 2nd opinion recommendation if she wanted. Pt feels that is not necessary, but will call if changes her mind

## 2017-12-16 LAB — PROTEIN, CSF: TOTAL PROTEIN, CSF: 32 mg/dL (ref 15–60)

## 2017-12-16 LAB — CSF CULTURE W GRAM STAIN
MICRO NUMBER:: 90804539
Result:: NO GROWTH
SPECIMEN QUALITY:: ADEQUATE

## 2017-12-16 LAB — CSF CELL COUNT WITH DIFFERENTIAL
RBC COUNT CSF: 3 {cells}/uL (ref 0–10)
WBC CSF: 1 {cells}/uL (ref 0–5)

## 2017-12-16 LAB — GLUCOSE, CSF: GLUCOSE CSF: 62 mg/dL (ref 40–80)

## 2017-12-22 ENCOUNTER — Ambulatory Visit: Payer: PPO | Admitting: Cardiology

## 2017-12-26 DIAGNOSIS — Z1212 Encounter for screening for malignant neoplasm of rectum: Secondary | ICD-10-CM | POA: Diagnosis not present

## 2017-12-26 DIAGNOSIS — Z1211 Encounter for screening for malignant neoplasm of colon: Secondary | ICD-10-CM | POA: Diagnosis not present

## 2017-12-27 ENCOUNTER — Other Ambulatory Visit: Payer: Self-pay | Admitting: Family

## 2017-12-27 DIAGNOSIS — M159 Polyosteoarthritis, unspecified: Secondary | ICD-10-CM

## 2017-12-27 DIAGNOSIS — M150 Primary generalized (osteo)arthritis: Secondary | ICD-10-CM

## 2017-12-27 DIAGNOSIS — F331 Major depressive disorder, recurrent, moderate: Secondary | ICD-10-CM

## 2017-12-27 DIAGNOSIS — F411 Generalized anxiety disorder: Secondary | ICD-10-CM

## 2017-12-28 ENCOUNTER — Other Ambulatory Visit: Payer: Self-pay | Admitting: *Deleted

## 2017-12-28 DIAGNOSIS — M159 Polyosteoarthritis, unspecified: Secondary | ICD-10-CM

## 2017-12-28 DIAGNOSIS — M150 Primary generalized (osteo)arthritis: Principal | ICD-10-CM

## 2017-12-28 MED ORDER — METHOCARBAMOL 500 MG PO TABS: ORAL_TABLET | ORAL | 0 refills | Status: DC

## 2017-12-28 NOTE — Telephone Encounter (Signed)
Last seen 11/15/17

## 2018-01-04 LAB — COLOGUARD: Cologuard: POSITIVE

## 2018-01-11 DIAGNOSIS — J410 Simple chronic bronchitis: Secondary | ICD-10-CM | POA: Diagnosis not present

## 2018-01-16 ENCOUNTER — Telehealth: Payer: Self-pay | Admitting: Family

## 2018-01-16 DIAGNOSIS — G8929 Other chronic pain: Secondary | ICD-10-CM

## 2018-01-16 DIAGNOSIS — F1120 Opioid dependence, uncomplicated: Secondary | ICD-10-CM

## 2018-01-16 DIAGNOSIS — Z0289 Encounter for other administrative examinations: Secondary | ICD-10-CM

## 2018-01-16 DIAGNOSIS — M159 Polyosteoarthritis, unspecified: Secondary | ICD-10-CM

## 2018-01-16 DIAGNOSIS — M150 Primary generalized (osteo)arthritis: Principal | ICD-10-CM

## 2018-01-16 DIAGNOSIS — M5441 Lumbago with sciatica, right side: Secondary | ICD-10-CM

## 2018-01-16 MED ORDER — OXYCODONE-ACETAMINOPHEN 7.5-325 MG PO TABS
1.0000 | ORAL_TABLET | Freq: Four times a day (QID) | ORAL | 0 refills | Status: DC | PRN
Start: 2018-01-16 — End: 2018-01-31

## 2018-01-16 NOTE — Telephone Encounter (Signed)
Prescription sent to pharmacy.

## 2018-01-20 ENCOUNTER — Other Ambulatory Visit: Payer: Self-pay | Admitting: Family

## 2018-01-20 DIAGNOSIS — M150 Primary generalized (osteo)arthritis: Principal | ICD-10-CM

## 2018-01-20 DIAGNOSIS — M159 Polyosteoarthritis, unspecified: Secondary | ICD-10-CM

## 2018-01-20 NOTE — Progress Notes (Signed)
Pt called states she is having a great deal of pain in knee with osteoarthritis. Will send rx for knee brace to see if this helps.

## 2018-01-22 ENCOUNTER — Other Ambulatory Visit: Payer: Self-pay | Admitting: Family

## 2018-01-22 DIAGNOSIS — F411 Generalized anxiety disorder: Secondary | ICD-10-CM

## 2018-01-22 DIAGNOSIS — F331 Major depressive disorder, recurrent, moderate: Secondary | ICD-10-CM

## 2018-01-25 ENCOUNTER — Encounter: Payer: Self-pay | Admitting: Cardiology

## 2018-01-25 NOTE — Progress Notes (Signed)
Cardiology Office Note  Date: 01/26/2018   ID: Kathleen ChimesWanda G Kube, DOB [birth year], MRN [id number]  PCP: Junie SpencerHawks, Christy A, FNP  Consulting Cardiologist: Nona DellSamuel McDowell, MD   Chief Complaint  Patient presents with  . Hyperlipidemia    History of Present Illness: Kathleen Saunders is a 67 y.o. female referred for cardiology consultation by Ms. Hawks NP for evaluation of hyperlipidemia.  She has no known history of ischemic heart disease, but does have family history of premature CAD.  Also personal history of hypertension and OSA.  She states that she is trying to be compliant with CPAP.  She reports chronic and stable dyspnea on exertion that she attributes to asthma.  No exertional chest pain or palpitations.  No syncope.  Recent lipid panel is outlined below.  She is intolerant of simvastatin, reporting diffuse aching when on that medication.  I did go over her current medical regimen which includes Prinzide for treatment of hypertension.  Blood pressure is reasonable today.  I personally reviewed her ECG today which shows sinus rhythm.  We discussed risk factor reduction and appropriate goals for lipids in light of her cardiac risk factor profile.  Past Medical History:  Diagnosis Date  . Anxiety   . Arthritis   . Depression   . Essential hypertension   . Hyperlipidemia   . OSA (obstructive sleep apnea)     Past Surgical History:  Procedure Laterality Date  . ABDOMINAL HYSTERECTOMY    . CARPAL TUNNEL RELEASE    . CHOLECYSTECTOMY    . GASTRIC BYPASS    . PLANTAR FASCIA RELEASE Right   . REPLACEMENT TOTAL KNEE Right     Current Outpatient Medications  Medication Sig Dispense Refill  . albuterol (PROVENTIL) (2.5 MG/3ML) 0.083% nebulizer solution Take 3 mLs (2.5 mg total) by nebulization every 6 (six) hours as needed for wheezing or shortness of breath. 150 mL 1  . ALPRAZolam (XANAX) 0.5 MG tablet Take 1 tablet (0.5 mg total) by mouth at bedtime as needed for anxiety. 30  tablet 3  . esomeprazole (NEXIUM) 20 MG capsule Take 1 capsule (20 mg total) by mouth daily. 90 capsule 1  . furosemide (LASIX) 20 MG tablet Take 1 tablet (20 mg total) by mouth daily. 30 tablet 11  . lisinopril-hydrochlorothiazide (PRINZIDE,ZESTORETIC) 20-25 MG tablet TAKE 1 TABLET BY MOUTH DAILY 90 tablet 1  . methocarbamol (ROBAXIN) 500 MG tablet TAKE 1 TABLET(500 MG) BY MOUTH EVERY 6 HOURS AS NEEDED FOR MUSCLE SPASMS 120 tablet 0  . naloxegol oxalate (MOVANTIK) 25 MG TABS tablet Take 1 tablet (25 mg total) by mouth daily. 30 tablet 3  . oxyCODONE-acetaminophen (PERCOCET) 7.5-325 MG tablet Take 1 tablet by mouth every 6 (six) hours as needed for severe pain. 120 tablet 0  . pantoprazole (PROTONIX) 40 MG tablet TAKE 1 TABLET(40 MG) BY MOUTH DAILY 90 tablet 0  . sertraline (ZOLOFT) 50 MG tablet TAKE 1 TABLET(50 MG) BY MOUTH DAILY (Patient taking differently: 100 mg. ) 90 tablet 0  . vitamin B-12 (CYANOCOBALAMIN) 500 MCG tablet Take 500 mcg by mouth daily.    . Vitamin D, Ergocalciferol, (DRISDOL) 50000 units CAPS capsule Take 1 capsule (50,000 Units total) by mouth every 7 (seven) days. 12 capsule 3  . rosuvastatin (CRESTOR) 5 MG tablet Take 1 tablet (5 mg total) by mouth every other day. 45 tablet 3   No current facility-administered medications for this visit.    Allergies:  Simvastatin   Social History: The patient  reports that she quit smoking about 30 years ago. Her smoking use included cigarettes. She has never used smokeless tobacco. She reports that she does not drink alcohol or use drugs.   Family History: The patient's family history includes Diabetes in her mother; Heart attack in her father; Heart disease in her father.   ROS:  Please see the history of present illness. Otherwise, complete review of systems is positive for arthritic pains.  All other systems are reviewed and negative.   Physical Exam: VS:  BP 136/74 (BP Location: Left Arm)   Pulse 73   Ht 5\' 3"  (1.6 m)   Wt  288 lb (130.6 kg)   SpO2 96%   BMI 51.02 kg/m , BMI Body mass index is 51.02 kg/m.  Wt Readings from Last 3 Encounters:  01/26/18 288 lb (130.6 kg)  11/15/17 282 lb 6.4 oz (128.1 kg)  07/12/17 282 lb (127.9 kg)    General: Obese woman, appears comfortable at rest. HEENT: Conjunctiva and lids normal, oropharynx clear. Neck: Supple, no elevated JVP or carotid bruits, no thyromegaly. Lungs: Clear to auscultation, nonlabored breathing at rest. Cardiac: Regular rate and rhythm, no S3, 2/6 systolic murmur. Abdomen: Soft, nontender, bowel sounds present, no guarding or rebound. Extremities: No pitting edema, distal pulses 2+. Skin: Warm and dry. Musculoskeletal: No kyphosis. Neuropsychiatric: Alert and oriented x3, affect grossly appropriate.  ECG: There is no old tracing available for comparison today.  Recent Labwork: 11/15/2017: ALT 11; AST 13; BUN 27; Creatinine, Ser 1.02; Hemoglobin 9.4; Platelets 421; Potassium 5.0; Sodium 141     Component Value Date/Time   CHOL 263 (H) 11/15/2017 1203   TRIG 194 (H) 11/15/2017 1203   HDL 59 11/15/2017 1203   CHOLHDL 4.5 (H) 11/15/2017 1203   LDLCALC 165 (H) 11/15/2017 1203    Other Studies Reviewed Today:  Echocardiogram 12/28/2005: SUMMARY - Overall left ventricular systolic function was at the lower    limits of normal. Left ventricular ejection fraction was    estimated to be 50 %. There were no left ventricular regional    wall motion abnormalities. - There was mild aortic valvular regurgitation. - There was mild mitral valvular regurgitation.  Assessment and Plan:  1.  Mixed hyperlipidemia as outlined above with history of intolerance to simvastatin.  With family history of premature CAD as well as personal history of hypertension, we discussed further lipid reduction as a means of additional risk factor reduction.  We discussed appropriate dietary measures and also weight loss which would be beneficial.  Also  begin trial of Crestor 5 mg every other day.  If she tolerates this, follow-up FLP and LFT in 8 weeks.  2.  Hypertension, on Prinzide.  No changes made today.  3.  OSA on CPAP.  4.  Murmur on examination.  No known history of valvular heart disease.  She recalls having an echocardiogram several years ago (no results in chart).  We will obtain a follow-up study.  Current medicines were reviewed with the patient today.   Orders Placed This Encounter  Procedures  . EKG 12-Lead  . ECHOCARDIOGRAM COMPLETE    Disposition: Call with test results.  Signed, Jonelle SidleSamuel G. McDowell, MD, [facility] Center[facility]ACC 01/26/2018 11:25 AM    [facility] at [facility] [address], [place], [place] [address] Phone: [phone]; Fax: [phone]

## 2018-01-26 ENCOUNTER — Ambulatory Visit (INDEPENDENT_AMBULATORY_CARE_PROVIDER_SITE_OTHER): Payer: PPO | Admitting: Nurse Practitioner

## 2018-01-26 ENCOUNTER — Encounter: Payer: Self-pay | Admitting: Nurse Practitioner

## 2018-01-26 ENCOUNTER — Encounter: Payer: Self-pay | Admitting: Cardiology

## 2018-01-26 ENCOUNTER — Ambulatory Visit: Payer: PPO | Admitting: Cardiology

## 2018-01-26 VITALS — BP 136/74 | HR 73 | Ht 63.0 in | Wt 288.0 lb

## 2018-01-26 VITALS — BP 136/63 | HR 57 | Temp 98.7°F | Ht 63.0 in | Wt 288.4 lb

## 2018-01-26 DIAGNOSIS — R011 Cardiac murmur, unspecified: Secondary | ICD-10-CM

## 2018-01-26 DIAGNOSIS — G4733 Obstructive sleep apnea (adult) (pediatric): Secondary | ICD-10-CM | POA: Diagnosis not present

## 2018-01-26 DIAGNOSIS — E782 Mixed hyperlipidemia: Secondary | ICD-10-CM | POA: Diagnosis not present

## 2018-01-26 DIAGNOSIS — I10 Essential (primary) hypertension: Secondary | ICD-10-CM | POA: Diagnosis not present

## 2018-01-26 DIAGNOSIS — M25562 Pain in left knee: Secondary | ICD-10-CM | POA: Diagnosis not present

## 2018-01-26 DIAGNOSIS — Z9989 Dependence on other enabling machines and devices: Secondary | ICD-10-CM | POA: Diagnosis not present

## 2018-01-26 MED ORDER — BUPIVACAINE HCL 0.25 % IJ SOLN
1.0000 mL | Freq: Once | INTRAMUSCULAR | Status: AC
  Administered 2018-01-26: 1 mL via INTRA_ARTICULAR

## 2018-01-26 MED ORDER — METHYLPREDNISOLONE ACETATE 40 MG/ML IJ SUSP
40.0000 mg | Freq: Once | INTRAMUSCULAR | Status: AC
  Administered 2018-01-26: 40 mg via INTRAMUSCULAR

## 2018-01-26 MED ORDER — ROSUVASTATIN CALCIUM 5 MG PO TABS: 5.0000 mg | ORAL_TABLET | ORAL | 3 refills | Status: DC

## 2018-01-26 NOTE — Patient Instructions (Signed)
Knee Injection, Care After  Refer to this sheet in the next few weeks. These instructions provide you with information about caring for yourself after your procedure. Your health care provider may also give you more specific instructions. Your treatment has been planned according to current medical practices, but problems sometimes occur. Call your health care provider if you have any problems or questions after your procedure.  What can I expect after the procedure?  After the procedure, it is common to have:   Soreness.   Warmth.   Swelling.    You may have more pain, swelling, and warmth than you did before the injection. This reaction may last for about one day.  Follow these instructions at home:  Bathing   If you were given a bandage (dressing), keep it dry until your health care provider says it can be removed. Ask your health care provider when you can start showering or taking a bath.  Managing pain, stiffness, and swelling   If directed, apply ice to the injection area:  ? Put ice in a plastic bag.  ? Place a towel between your skin and the bag.  ? Leave the ice on for 20 minutes, 2-3 times per day.   Do not apply heat to your knee.   Raise the injection area above the level of your heart while you are sitting or lying down.  Activity   Avoid strenuous activities for as long as directed by your health care provider. Ask your health care provider when you can return to your normal activities.  General instructions   Take medicines only as directed by your health care provider.   Do not take aspirin or other over-the-counter medicines unless your health care provider says you can.   Check your injection site every day for signs of infection. Watch for:  ? Redness, swelling, or pain.  ? Fluid, blood, or pus.   Follow your health care provider's instructions about dressing changes and removal.  Contact a health care provider if:   You have symptoms at your injection site that last longer than  two days after your procedure.   You have redness, swelling, or pain in your injection area.   You have fluid, blood, or pus coming from your injection site.   You have warmth in your injection area.   You have a fever.   Your pain is not controlled with medicine.  Get help right away if:   Your knee turns very red.   Your knee becomes very swollen.   Your knee pain is severe.  This information is not intended to replace advice given to you by your health care provider. Make sure you discuss any questions you have with your health care provider.  Document Released: 06/14/2014 Document Revised: 01/28/2016 Document Reviewed: 04/03/2014  Elsevier Interactive Patient Education  2018 [employer] Inc[employer]

## 2018-01-26 NOTE — Patient Instructions (Addendum)
Your physician wants you to follow-up in: to be determined With Dr.McDowell     START Crestor 5 mg EVERY OTHER DAY at dinner     Repeat Lipids, and LFT's in 8 weeks     Your physician has requested that you have an echocardiogram. Echocardiography is a painless test that uses sound waves to create images of your heart. It provides your doctor with information about the size and shape of your heart and how well your heart's chambers and valves are working. This procedure takes approximately one hour. There are no restrictions for this procedure.                       Thank you for choosing [facility] !

## 2018-01-26 NOTE — Progress Notes (Signed)
   Subjective:    Patient ID: Kathleen Saunders, female    DOB: [birth year], 67 y.o.   MRN: [id number]   Chief Complaint: Left knee pain  HPI Patient is here today c/o left knee pain. She was walking and twisted it about 3 weeks ago and it has gradually been getting worse. 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    Review of Systems  Constitutional: Negative for activity change and appetite change.  HENT: Negative.   Eyes: Negative for pain.  Respiratory: Negative for shortness of breath.   Cardiovascular: Negative for chest pain, palpitations and leg swelling.  Gastrointestinal: Negative for abdominal pain.  Endocrine: Negative for polydipsia.  Genitourinary: Negative.   Skin: Negative for rash.  Neurological: Negative for dizziness, weakness and headaches.  Hematological: Does not bruise/bleed easily.  Psychiatric/Behavioral: Negative.   All other systems reviewed and are negative.      Objective:   Physical Exam  Constitutional: She is oriented to person, place, and time. She appears well-developed and well-nourished. No distress.  Cardiovascular: Normal rate.  Pulmonary/Chest: Effort normal.  Neurological: She is alert and oriented to person, place, and time.  Skin: Skin is warm.  Psychiatric: She has a normal mood and affect. Her behavior is normal.    BP 136/63 (BP Location: Left Wrist, Patient Position: Sitting, Cuff Size: Small)   Pulse (!) 57   Temp 98.7 F (37.1 C) (Oral)   Ht 5\' 3"  (1.6 m)   Wt 288 lb 6.4 oz (130.8 kg)   BMI 51.09 kg/m   Right knee injection under sterile technique  Marcaie 0.25 1ml with depomedrol 40mg /ml 1ml injtected uinto left knee with 22 1 1/2 needle.      Assessment & Plan:  Kathleen Saunders in today with chief complaint of Knee Pain (left knee pain x 3 weeks)   1. Acute pain of left knee Ice bid Elevate when sitting Referral to ortho if not improving - methylPREDNISolone acetate  (DEPO-MEDROL) injection 40 mg - bupivacaine (MARCAINE) 0.25 % (with pres) injection 1 mL   Mary-Margaret Daphine DeutscherMartin, FNP

## 2018-01-30 ENCOUNTER — Ambulatory Visit (HOSPITAL_COMMUNITY)
Admission: RE | Admit: 2018-01-30 | Discharge: 2018-01-30 | Disposition: A | Discharge status: Home / Self Care | Payer: PPO | Source: Ambulatory Visit | Attending: Cardiology | Admitting: Cardiology

## 2018-01-30 DIAGNOSIS — G4733 Obstructive sleep apnea (adult) (pediatric): Secondary | ICD-10-CM | POA: Insufficient documentation

## 2018-01-30 DIAGNOSIS — I080 Rheumatic disorders of both mitral and aortic valves: Secondary | ICD-10-CM | POA: Insufficient documentation

## 2018-01-30 DIAGNOSIS — R011 Cardiac murmur, unspecified: Secondary | ICD-10-CM | POA: Diagnosis not present

## 2018-01-30 DIAGNOSIS — Z6841 Body Mass Index (BMI) 40.0 and over, adult: Secondary | ICD-10-CM | POA: Diagnosis not present

## 2018-01-30 DIAGNOSIS — E785 Hyperlipidemia, unspecified: Secondary | ICD-10-CM | POA: Diagnosis not present

## 2018-01-30 DIAGNOSIS — I10 Essential (primary) hypertension: Secondary | ICD-10-CM | POA: Insufficient documentation

## 2018-01-30 NOTE — Progress Notes (Signed)
*  PRELIMINARY RESULTS* Echocardiogram 2D Echocardiogram has been performed.  Stacey DrainWhite, Reda Gettis J 01/30/2018, 2:28 PM

## 2018-01-31 ENCOUNTER — Ambulatory Visit (INDEPENDENT_AMBULATORY_CARE_PROVIDER_SITE_OTHER): Payer: PPO | Admitting: Family

## 2018-01-31 ENCOUNTER — Encounter: Payer: Self-pay | Admitting: Family

## 2018-01-31 VITALS — BP 153/76 | HR 63 | Temp 98.0°F | Ht 63.0 in | Wt 285.6 lb

## 2018-01-31 DIAGNOSIS — G8929 Other chronic pain: Secondary | ICD-10-CM | POA: Diagnosis not present

## 2018-01-31 DIAGNOSIS — M5441 Lumbago with sciatica, right side: Secondary | ICD-10-CM | POA: Diagnosis not present

## 2018-01-31 DIAGNOSIS — M150 Primary generalized (osteo)arthritis: Secondary | ICD-10-CM | POA: Diagnosis not present

## 2018-01-31 DIAGNOSIS — E782 Mixed hyperlipidemia: Secondary | ICD-10-CM

## 2018-01-31 DIAGNOSIS — F331 Major depressive disorder, recurrent, moderate: Secondary | ICD-10-CM

## 2018-01-31 DIAGNOSIS — Z6841 Body Mass Index (BMI) 40.0 and over, adult: Secondary | ICD-10-CM

## 2018-01-31 DIAGNOSIS — F411 Generalized anxiety disorder: Secondary | ICD-10-CM

## 2018-01-31 DIAGNOSIS — J4540 Moderate persistent asthma, uncomplicated: Secondary | ICD-10-CM | POA: Diagnosis not present

## 2018-01-31 DIAGNOSIS — G4733 Obstructive sleep apnea (adult) (pediatric): Secondary | ICD-10-CM | POA: Diagnosis not present

## 2018-01-31 DIAGNOSIS — F1120 Opioid dependence, uncomplicated: Secondary | ICD-10-CM

## 2018-01-31 DIAGNOSIS — I10 Essential (primary) hypertension: Secondary | ICD-10-CM

## 2018-01-31 DIAGNOSIS — Z0289 Encounter for other administrative examinations: Secondary | ICD-10-CM

## 2018-01-31 DIAGNOSIS — Z9989 Dependence on other enabling machines and devices: Secondary | ICD-10-CM | POA: Diagnosis not present

## 2018-01-31 DIAGNOSIS — E559 Vitamin D deficiency, unspecified: Secondary | ICD-10-CM

## 2018-01-31 DIAGNOSIS — M159 Polyosteoarthritis, unspecified: Secondary | ICD-10-CM

## 2018-01-31 MED ORDER — OXYCODONE-ACETAMINOPHEN 7.5-325 MG PO TABS: 1.0000 | ORAL_TABLET | Freq: Four times a day (QID) | ORAL | 0 refills | Status: DC | PRN

## 2018-01-31 MED ORDER — METHOCARBAMOL 500 MG PO TABS: ORAL_TABLET | ORAL | 0 refills | Status: DC

## 2018-01-31 MED ORDER — ALPRAZOLAM 0.5 MG PO TABS: 0.5000 mg | ORAL_TABLET | Freq: Every evening | ORAL | 3 refills | Status: DC | PRN

## 2018-01-31 MED ORDER — BUDESONIDE-FORMOTEROL FUMARATE 80-4.5 MCG/ACT IN AERO: 2.0000 | INHALATION_SPRAY | Freq: Two times a day (BID) | RESPIRATORY_TRACT | 3 refills | Status: DC

## 2018-01-31 NOTE — Patient Instructions (Signed)

## 2018-01-31 NOTE — Progress Notes (Signed)
Subjective:    Patient ID: Kathleen Saunders, female    DOB: 07-28-1950, 67 y.o.   MRN: [id number]  Chief Complaint  Patient presents with  . Medical Management of Chronic Issues    refills   Pt presents to the office today for chronic follow up. Pt is under a great deal of stress with caring for her husband. PT is followed by Cardiologists every 6 months.  Hypertension  This is a chronic problem. The current episode started more than 1 year ago. The problem has been waxing and waning since onset. The problem is uncontrolled. Associated symptoms include anxiety and malaise/fatigue. Pertinent negatives include no peripheral edema or shortness of breath. Risk factors for coronary artery disease include dyslipidemia, obesity and sedentary lifestyle. The current treatment provides moderate improvement. Hypertensive end-organ damage includes CAD/MI. There is no history of heart failure. Identifiable causes of hypertension include sleep apnea.  Asthma  She complains of wheezing. There is no cough or shortness of breath. This is a chronic problem. The current episode started more than 1 year ago. The problem has been waxing and waning. Associated symptoms include malaise/fatigue. Pertinent negatives include no dyspnea on exertion, ear congestion or ear pain. She reports moderate improvement on treatment. Her symptoms are not alleviated by rest. Her past medical history is significant for asthma.  Hyperlipidemia  This is a chronic problem. The current episode started more than 1 year ago. The problem is uncontrolled. Recent lipid tests were reviewed and are high. Exacerbating diseases include obesity. Associated symptoms include leg pain. Pertinent negatives include no shortness of breath. Current antihyperlipidemic treatment includes statins. The current treatment provides moderate improvement of lipids. Risk factors for coronary artery disease include dyslipidemia, diabetes mellitus, female sex, hypertension  and a sedentary lifestyle.  Anxiety  Presents for follow-up visit. Symptoms include excessive worry, irritability and nervous/anxious behavior. Patient reports no shortness of breath. Symptoms occur most days. The severity of symptoms is moderate.   Her past medical history is significant for asthma.  Back Pain  This is a chronic problem. The current episode started more than 1 year ago. The problem occurs intermittently. The problem has been waxing and waning since onset. The pain is present in the lumbar spine. The quality of the pain is described as aching. The pain is at a severity of 8/10 (when walking or doing things). The pain is moderate. Associated symptoms include leg pain. Pertinent negatives include no bladder incontinence or bowel incontinence. She has tried analgesics for the symptoms. The treatment provided moderate relief.  Depression         This is a chronic problem.  The current episode started more than 1 year ago.   The onset quality is gradual.   The problem occurs intermittently.  The problem has been waxing and waning since onset.  Associated symptoms include irritable, decreased interest and sad.  Associated symptoms include no helplessness and no hopelessness.  Past treatments include nothing.  Past medical history includes anxiety.   Arthritis  Presents for follow-up visit. Affected locations include the right knee and left knee (back). Her pain is at a severity of 6/10.  OSA She has started using her CPAP and has noticed her fatigue had improved.    Review of Systems  Constitutional: Positive for irritability and malaise/fatigue.  HENT: Negative for ear pain.   Respiratory: Positive for wheezing. Negative for cough and shortness of breath.   Cardiovascular: Negative for dyspnea on exertion.  Gastrointestinal: Negative for bowel  incontinence.  Genitourinary: Negative for bladder incontinence.  Musculoskeletal: Positive for arthritis and back pain.    Psychiatric/Behavioral: Positive for depression. The patient is nervous/anxious.   All other systems reviewed and are negative.      Objective:   Physical Exam  Constitutional: She is oriented to person, place, and time. She appears well-developed and well-nourished. She is irritable. No distress.  Morbid obese   HENT:  Head: Normocephalic and atraumatic.  Right Ear: External ear normal.  Left Ear: External ear normal.  Mouth/Throat: Oropharynx is clear and moist.  Eyes: Pupils are equal, round, and reactive to light.  Neck: Normal range of motion. Neck supple. No thyromegaly present.  Cardiovascular: Normal rate, regular rhythm, normal heart sounds and intact distal pulses.  No murmur heard. Pulmonary/Chest: Effort normal and breath sounds normal. No respiratory distress. She has no wheezes.  Abdominal: Soft. Bowel sounds are normal. She exhibits no distension. There is no tenderness.  Musculoskeletal: She exhibits no edema or tenderness.  Pain in lumbar with flexion and extension  Neurological: She is alert and oriented to person, place, and time. She has normal reflexes. No cranial nerve deficit.  Skin: Skin is warm and dry.  Psychiatric: She has a normal mood and affect. Her behavior is normal. Judgment and thought content normal.  Vitals reviewed.     BP (!) 153/76   Pulse 63   Temp 98 F (36.7 C) (Oral)   Ht _0  (1.6 m)   Wt 285 lb 9.6 oz (129.5 kg)   BMI 50.59 kg/m      Assessment & Plan:  ECHO PROPP comes in today with chief complaint of Medical Management of Chronic Issues (refills)   Diagnosis and orders addressed:  1. Moderate persistent asthma without complication Will start Symbicort today - budesonide-formoterol (SYMBICORT) 80-4.5 MCG/ACT inhaler; Inhale 2 puffs into the lungs 2 (two) times daily.  Dispense: 1 Inhaler; Refill: 3 - CMP14+EGFR - CBC with Differential/Platelet  2. Chronic bilateral low back pain with right-sided sciatica -  oxyCODONE-acetaminophen (PERCOCET) 7.5-325 MG tablet; Take 1 tablet by mouth every 6 (six) hours as needed for severe pain.  Dispense: 120 tablet; Refill: 0 - oxyCODONE-acetaminophen (PERCOCET) 7.5-325 MG tablet; Take 1 tablet by mouth every 6 (six) hours as needed for severe pain.  Dispense: 120 tablet; Refill: 0 - oxyCODONE-acetaminophen (PERCOCET) 7.5-325 MG tablet; Take 1 tablet by mouth every 6 (six) hours as needed for severe pain.  Dispense: 120 tablet; Refill: 0 - CMP14+EGFR - CBC with Differential/Platelet  3. Moderate episode of recurrent major depressive disorder (HCC) - CMP14+EGFR - CBC with Differential/Platelet  4. GAD (generalized anxiety disorder) - CMP14+EGFR - CBC with Differential/Platelet  5. Mixed hyperlipidemia - CMP14+EGFR - CBC with Differential/Platelet  6. Pain medication agreement signed - oxyCODONE-acetaminophen (PERCOCET) 7.5-325 MG tablet; Take 1 tablet by mouth every 6 (six) hours as needed for severe pain.  Dispense: 120 tablet; Refill: 0 - oxyCODONE-acetaminophen (PERCOCET) 7.5-325 MG tablet; Take 1 tablet by mouth every 6 (six) hours as needed for severe pain.  Dispense: 120 tablet; Refill: 0 - oxyCODONE-acetaminophen (PERCOCET) 7.5-325 MG tablet; Take 1 tablet by mouth every 6 (six) hours as needed for severe pain.  Dispense: 120 tablet; Refill: 0 - CMP14+EGFR - CBC with Differential/Platelet  7. Primary osteoarthritis involving multiple joints - oxyCODONE-acetaminophen (PERCOCET) 7.5-325 MG tablet; Take 1 tablet by mouth every 6 (six) hours as needed for severe pain.  Dispense: 120 tablet; Refill: 0 - oxyCODONE-acetaminophen (PERCOCET) 7.5-325 MG tablet; Take 1  tablet by mouth every 6 (six) hours as needed for severe pain.  Dispense: 120 tablet; Refill: 0 - oxyCODONE-acetaminophen (PERCOCET) 7.5-325 MG tablet; Take 1 tablet by mouth every 6 (six) hours as needed for severe pain.  Dispense: 120 tablet; Refill: 0 - CMP14+EGFR - CBC with  Differential/Platelet  8. OSA on CPAP - CMP14+EGFR - CBC with Differential/Platelet  9. Uncomplicated opioid dependence ([place]) - oxyCODONE-acetaminophen (PERCOCET) 7.5-325 MG tablet; Take 1 tablet by mouth every 6 (six) hours as needed for severe pain.  Dispense: 120 tablet; Refill: 0 - oxyCODONE-acetaminophen (PERCOCET) 7.5-325 MG tablet; Take 1 tablet by mouth every 6 (six) hours as needed for severe pain.  Dispense: 120 tablet; Refill: 0 - oxyCODONE-acetaminophen (PERCOCET) 7.5-325 MG tablet; Take 1 tablet by mouth every 6 (six) hours as needed for severe pain.  Dispense: 120 tablet; Refill: 0 - CMP14+EGFR - CBC with Differential/Platelet  10. Morbid obesity with BMI of 50.0-59.9, adult (HCC) - CMP14+EGFR - CBC with Differential/Platelet  11. Essential hypertension - CMP14+EGFR - CBC with Differential/Platelet  12. Vitamin D deficiency - CMP14+EGFR - CBC with Differential/Platelet - VITAMIN D 25 Hydroxy (Vit-D Deficiency, Fractures)   Labs pending Pt reviewed in [place] controlled database, no red flags noted Health Maintenance reviewed Diet and exercise encouraged  Follow up plan: 3 months    Evelina Dun, FNP

## 2018-01-31 NOTE — Addendum Note (Signed)
Addended by: Jannifer RodneyHAWKS, CHRISTY A on: 01/31/2018 10:59 AM   Modules accepted: Orders

## 2018-02-01 LAB — CMP14+EGFR
A/G RATIO: 1.6 (ref 1.2–2.2)
ALBUMIN: 4.1 g/dL (ref 3.6–4.8)
ALT: 12 IU/L (ref 0–32)
AST: 14 IU/L (ref 0–40)
Alkaline Phosphatase: 88 IU/L (ref 39–117)
BILIRUBIN TOTAL: 0.2 mg/dL (ref 0.0–1.2)
BUN/Creatinine Ratio: 20 (ref 12–28)
BUN: 24 mg/dL (ref 8–27)
CALCIUM: 9.5 mg/dL (ref 8.7–10.3)
CHLORIDE: 101 mmol/L (ref 96–106)
CO2: 26 mmol/L (ref 20–29)
Creatinine, Ser: 1.18 mg/dL — ABNORMAL HIGH (ref 0.57–1.00)
GFR, EST AFRICAN AMERICAN: 56 mL/min/{1.73_m2} — AB (ref >59)
GFR, EST NON AFRICAN AMERICAN: 49 mL/min/{1.73_m2} — AB (ref >59)
GLOBULIN, TOTAL: 2.6 g/dL (ref 1.5–4.5)
Glucose: 86 mg/dL (ref 65–99)
Potassium: 4.6 mmol/L (ref 3.5–5.2)
Sodium: 141 mmol/L (ref 134–144)
TOTAL PROTEIN: 6.7 g/dL (ref 6.0–8.5)

## 2018-02-01 LAB — CBC WITH DIFFERENTIAL/PLATELET
BASOS ABS: 0.1 10*3/uL (ref 0.0–0.2)
Basos: 1 %
EOS (ABSOLUTE): 0.3 10*3/uL (ref 0.0–0.4)
EOS: 3 %
HEMATOCRIT: 30.8 % — AB (ref 34.0–46.6)
HEMOGLOBIN: 9.5 g/dL — AB (ref 11.1–15.9)
IMMATURE GRANULOCYTES: 1 %
Immature Grans (Abs): 0.1 10*3/uL (ref 0.0–0.1)
Lymphocytes Absolute: 2.3 10*3/uL (ref 0.7–3.1)
Lymphs: 22 %
MCH: 25.5 pg — ABNORMAL LOW (ref 26.6–33.0)
MCHC: 30.8 g/dL — ABNORMAL LOW (ref 31.5–35.7)
MCV: 83 fL (ref 79–97)
MONOCYTES: 8 %
MONOS ABS: 0.8 10*3/uL (ref 0.1–0.9)
NEUTROS PCT: 65 %
Neutrophils Absolute: 6.9 10*3/uL (ref 1.4–7.0)
Platelets: 395 10*3/uL (ref 150–450)
RBC: 3.73 x10E6/uL — AB (ref 3.77–5.28)
RDW: 16 % — AB (ref 12.3–15.4)
WBC: 10.4 10*3/uL (ref 3.4–10.8)

## 2018-02-01 LAB — VITAMIN D 25 HYDROXY (VIT D DEFICIENCY, FRACTURES): VIT D 25 HYDROXY: 17.1 ng/mL — AB (ref 30.0–100.0)

## 2018-02-09 ENCOUNTER — Other Ambulatory Visit: Payer: Self-pay | Admitting: Family

## 2018-02-11 DIAGNOSIS — J410 Simple chronic bronchitis: Secondary | ICD-10-CM | POA: Diagnosis not present

## 2018-02-28 ENCOUNTER — Other Ambulatory Visit: Payer: Self-pay | Admitting: Family

## 2018-02-28 DIAGNOSIS — M159 Polyosteoarthritis, unspecified: Secondary | ICD-10-CM

## 2018-02-28 DIAGNOSIS — M150 Primary generalized (osteo)arthritis: Principal | ICD-10-CM

## 2018-03-02 ENCOUNTER — Other Ambulatory Visit: Payer: Self-pay | Admitting: Orthopedic Surgery

## 2018-03-02 ENCOUNTER — Other Ambulatory Visit (INDEPENDENT_AMBULATORY_CARE_PROVIDER_SITE_OTHER): Payer: PPO

## 2018-03-02 DIAGNOSIS — R52 Pain, unspecified: Secondary | ICD-10-CM

## 2018-03-02 DIAGNOSIS — M545 Low back pain: Secondary | ICD-10-CM | POA: Diagnosis not present

## 2018-03-02 DIAGNOSIS — M25562 Pain in left knee: Secondary | ICD-10-CM | POA: Diagnosis not present

## 2018-03-02 DIAGNOSIS — M1712 Unilateral primary osteoarthritis, left knee: Secondary | ICD-10-CM | POA: Diagnosis not present

## 2018-03-02 NOTE — Telephone Encounter (Signed)
Last seen 01/31/18 

## 2018-03-03 ENCOUNTER — Other Ambulatory Visit: Payer: Self-pay | Admitting: Family

## 2018-03-03 MED ORDER — COLCHICINE 0.6 MG PO TABS: ORAL_TABLET | ORAL | 2 refills | Status: DC

## 2018-03-13 DIAGNOSIS — J410 Simple chronic bronchitis: Secondary | ICD-10-CM | POA: Diagnosis not present

## 2018-03-31 ENCOUNTER — Other Ambulatory Visit: Payer: Self-pay | Admitting: Family

## 2018-03-31 DIAGNOSIS — M159 Polyosteoarthritis, unspecified: Secondary | ICD-10-CM

## 2018-03-31 DIAGNOSIS — M150 Primary generalized (osteo)arthritis: Principal | ICD-10-CM

## 2018-04-03 ENCOUNTER — Other Ambulatory Visit: Payer: Self-pay | Admitting: Family

## 2018-04-03 DIAGNOSIS — Z0289 Encounter for other administrative examinations: Secondary | ICD-10-CM

## 2018-04-03 DIAGNOSIS — M159 Polyosteoarthritis, unspecified: Secondary | ICD-10-CM

## 2018-04-03 DIAGNOSIS — G8929 Other chronic pain: Secondary | ICD-10-CM

## 2018-04-03 DIAGNOSIS — M150 Primary generalized (osteo)arthritis: Principal | ICD-10-CM

## 2018-04-03 DIAGNOSIS — F1120 Opioid dependence, uncomplicated: Secondary | ICD-10-CM

## 2018-04-03 DIAGNOSIS — M5441 Lumbago with sciatica, right side: Secondary | ICD-10-CM

## 2018-04-03 MED ORDER — PREDNISONE 10 MG (21) PO TBPK: ORAL_TABLET | ORAL | 0 refills | Status: DC

## 2018-04-03 MED ORDER — OXYCODONE-ACETAMINOPHEN 7.5-325 MG PO TABS: 1.0000 | ORAL_TABLET | Freq: Four times a day (QID) | ORAL | 0 refills | Status: DC | PRN

## 2018-04-03 MED ORDER — DOXYCYCLINE HYCLATE 100 MG PO TABS
100.0000 mg | ORAL_TABLET | Freq: Two times a day (BID) | ORAL | 0 refills | Status: DC
Start: 2018-04-03 — End: 2018-05-02

## 2018-04-03 MED ORDER — METHOCARBAMOL 500 MG PO TABS: 500.0000 mg | ORAL_TABLET | Freq: Four times a day (QID) | ORAL | 0 refills | Status: DC | PRN

## 2018-04-03 NOTE — Progress Notes (Signed)
Pt called with complaints of productive coughing, wheezing, and SOB. Will send in doxycyline and prednisone. She will make appt to be seen in the next week or so.

## 2018-04-03 NOTE — Telephone Encounter (Signed)
Last seen 01/31/18 

## 2018-04-13 DIAGNOSIS — J410 Simple chronic bronchitis: Secondary | ICD-10-CM | POA: Diagnosis not present

## 2018-05-02 ENCOUNTER — Ambulatory Visit (INDEPENDENT_AMBULATORY_CARE_PROVIDER_SITE_OTHER): Payer: PPO | Admitting: Family

## 2018-05-02 ENCOUNTER — Encounter: Payer: Self-pay | Admitting: Family

## 2018-05-02 VITALS — BP 120/67 | HR 78 | Temp 97.9°F | Ht 63.0 in | Wt 283.8 lb

## 2018-05-02 DIAGNOSIS — F331 Major depressive disorder, recurrent, moderate: Secondary | ICD-10-CM | POA: Diagnosis not present

## 2018-05-02 DIAGNOSIS — F1120 Opioid dependence, uncomplicated: Secondary | ICD-10-CM | POA: Diagnosis not present

## 2018-05-02 DIAGNOSIS — I10 Essential (primary) hypertension: Secondary | ICD-10-CM

## 2018-05-02 DIAGNOSIS — Z0289 Encounter for other administrative examinations: Secondary | ICD-10-CM

## 2018-05-02 DIAGNOSIS — Z6841 Body Mass Index (BMI) 40.0 and over, adult: Secondary | ICD-10-CM | POA: Diagnosis not present

## 2018-05-02 DIAGNOSIS — M5441 Lumbago with sciatica, right side: Secondary | ICD-10-CM

## 2018-05-02 DIAGNOSIS — G8929 Other chronic pain: Secondary | ICD-10-CM | POA: Diagnosis not present

## 2018-05-02 DIAGNOSIS — J4540 Moderate persistent asthma, uncomplicated: Secondary | ICD-10-CM

## 2018-05-02 DIAGNOSIS — F411 Generalized anxiety disorder: Secondary | ICD-10-CM | POA: Diagnosis not present

## 2018-05-02 DIAGNOSIS — M150 Primary generalized (osteo)arthritis: Secondary | ICD-10-CM | POA: Diagnosis not present

## 2018-05-02 DIAGNOSIS — M159 Polyosteoarthritis, unspecified: Secondary | ICD-10-CM

## 2018-05-02 DIAGNOSIS — Z23 Encounter for immunization: Secondary | ICD-10-CM

## 2018-05-02 MED ORDER — OXYCODONE-ACETAMINOPHEN 7.5-325 MG PO TABS: 1.0000 | ORAL_TABLET | Freq: Four times a day (QID) | ORAL | 0 refills | Status: DC | PRN

## 2018-05-02 NOTE — Patient Instructions (Signed)

## 2018-05-02 NOTE — Progress Notes (Signed)
Subjective:    Patient ID: Kathleen Saunders, female    DOB: September 15, 1950, 67 y.o.   MRN: [id number]  Chief Complaint  Patient presents with  . Medical Management of Chronic Issues   PT presents to the office today for chronic follow up. Pt is followed by Cardiologists for murmur. She had a normal Echo.  She also see's Ortho for bilateral knee pain and back pain.  Arthritis  Presents for follow-up visit. She complains of pain and stiffness. The symptoms have been worsening. Affected locations include the right knee and left knee (back). Her pain is at a severity of 10/10.  Hypertension  This is a chronic problem. The current episode started more than 1 year ago. The problem has been resolved since onset. The problem is controlled. Associated symptoms include malaise/fatigue. Pertinent negatives include no headaches, peripheral edema or shortness of breath. Risk factors for coronary artery disease include dyslipidemia, obesity and sedentary lifestyle. The current treatment provides moderate improvement. Hypertensive end-organ damage includes kidney disease. There is no history of left ventricular hypertrophy.  Asthma  She complains of cough and wheezing. There is no shortness of breath. This is a chronic problem. The current episode started more than 1 year ago. The problem occurs intermittently. The problem has been waxing and waning. Associated symptoms include malaise/fatigue. Pertinent negatives include no headaches. Her past medical history is significant for asthma.  Back Pain  This is a chronic problem. The current episode started more than 1 year ago. The problem occurs intermittently. The problem has been waxing and waning since onset. The pain is present in the lumbar spine. The pain is at a severity of 10/10. The pain is moderate. Pertinent negatives include no headaches.  Depression         This is a chronic problem.  The current episode started more than 1 year ago.   The onset quality is  gradual.   The problem occurs intermittently.  Associated symptoms include irritable, restlessness and decreased interest.  Associated symptoms include no helplessness, no hopelessness, no headaches and not sad. Hyperlipidemia  This is a chronic problem. The current episode started more than 1 year ago. The problem is uncontrolled. Recent lipid tests were reviewed and are high. Exacerbating diseases include obesity. Pertinent negatives include no shortness of breath. Current antihyperlipidemic treatment includes diet change. Risk factors for coronary artery disease include dyslipidemia, hypertension, a sedentary lifestyle and post-menopausal.      Review of Systems  Constitutional: Positive for malaise/fatigue.  Respiratory: Positive for cough and wheezing. Negative for shortness of breath.   Musculoskeletal: Positive for arthritis, back pain and stiffness.  Neurological: Negative for headaches.  Psychiatric/Behavioral: Positive for depression.  All other systems reviewed and are negative.      Objective:   Physical Exam  Constitutional: She is oriented to person, place, and time. She appears well-developed and well-nourished. She is irritable. No distress.  Morbid obese  HENT:  Head: Normocephalic and atraumatic.  Right Ear: External ear normal.  Left Ear: External ear normal.  Mouth/Throat: Oropharynx is clear and moist.  Eyes: Pupils are equal, round, and reactive to light.  Neck: Normal range of motion. Neck supple. No thyromegaly present.  Cardiovascular: Normal rate, regular rhythm, normal heart sounds and intact distal pulses.  No murmur heard. Pulmonary/Chest: Effort normal and breath sounds normal. No respiratory distress. She has no wheezes.  Abdominal: Soft. Bowel sounds are normal. She exhibits no distension. There is no tenderness.  Musculoskeletal: She exhibits tenderness.  She exhibits no edema.  Lumbar pain with flexion and extension. Pain in bilateral knees with  flexion and extension   Neurological: She is alert and oriented to person, place, and time. She has normal reflexes. No cranial nerve deficit.  Skin: Skin is warm and dry.  Psychiatric: She has a normal mood and affect. Her behavior is normal. Judgment and thought content normal.  Vitals reviewed.     BP 120/67   Pulse 78   Temp 97.9 F (36.6 C) (Oral)   Ht 5\' 3"  (1.6 m)   Wt 283 lb 12.8 oz (128.7 kg)   BMI 50.27 kg/m      Assessment & Plan:  Kathleen Saunders comes in today with chief complaint of Medical Management of Chronic Issues   Diagnosis and orders addressed:  1. Essential hypertension  2. Moderate episode of recurrent major depressive disorder (HCC)  3. GAD (generalized anxiety disorder)  4. Primary osteoarthritis involving multiple joints - oxyCODONE-acetaminophen (PERCOCET) 7.5-325 MG tablet; Take 1 tablet by mouth every 6 (six) hours as needed for severe pain.  Dispense: 120 tablet; Refill: 0 - oxyCODONE-acetaminophen (PERCOCET) 7.5-325 MG tablet; Take 1 tablet by mouth every 6 (six) hours as needed for severe pain.  Dispense: 120 tablet; Refill: 0 - oxyCODONE-acetaminophen (PERCOCET) 7.5-325 MG tablet; Take 1 tablet by mouth every 6 (six) hours as needed for severe pain.  Dispense: 120 tablet; Refill: 0  5. Moderate persistent asthma without complication  6. Morbid obesity with BMI of 50.0-59.9, adult (HCC)  7. Uncomplicated opioid dependence (HCC) - oxyCODONE-acetaminophen (PERCOCET) 7.5-325 MG tablet; Take 1 tablet by mouth every 6 (six) hours as needed for severe pain.  Dispense: 120 tablet; Refill: 0 - oxyCODONE-acetaminophen (PERCOCET) 7.5-325 MG tablet; Take 1 tablet by mouth every 6 (six) hours as needed for severe pain.  Dispense: 120 tablet; Refill: 0 - oxyCODONE-acetaminophen (PERCOCET) 7.5-325 MG tablet; Take 1 tablet by mouth every 6 (six) hours as needed for severe pain.  Dispense: 120 tablet; Refill: 0  8. Pain medication agreement signed -  oxyCODONE-acetaminophen (PERCOCET) 7.5-325 MG tablet; Take 1 tablet by mouth every 6 (six) hours as needed for severe pain.  Dispense: 120 tablet; Refill: 0 - oxyCODONE-acetaminophen (PERCOCET) 7.5-325 MG tablet; Take 1 tablet by mouth every 6 (six) hours as needed for severe pain.  Dispense: 120 tablet; Refill: 0 - oxyCODONE-acetaminophen (PERCOCET) 7.5-325 MG tablet; Take 1 tablet by mouth every 6 (six) hours as needed for severe pain.  Dispense: 120 tablet; Refill: 0  9. Chronic bilateral low back pain with right-sided sciatica - oxyCODONE-acetaminophen (PERCOCET) 7.5-325 MG tablet; Take 1 tablet by mouth every 6 (six) hours as needed for severe pain.  Dispense: 120 tablet; Refill: 0 - oxyCODONE-acetaminophen (PERCOCET) 7.5-325 MG tablet; Take 1 tablet by mouth every 6 (six) hours as needed for severe pain.  Dispense: 120 tablet; Refill: 0 - oxyCODONE-acetaminophen (PERCOCET) 7.5-325 MG tablet; Take 1 tablet by mouth every 6 (six) hours as needed for severe pain.  Dispense: 120 tablet; Refill: 0  10. Encounter for immunization - Flu vaccine HIGH DOSE PF    Health Maintenance reviewed Diet and exercise encouraged  Follow up plan: 3 months   Jannifer Rodneyhristy Aldrich Lloyd, FNP

## 2018-05-05 ENCOUNTER — Other Ambulatory Visit: Payer: Self-pay | Admitting: Family

## 2018-05-05 DIAGNOSIS — R195 Other fecal abnormalities: Secondary | ICD-10-CM

## 2018-05-11 ENCOUNTER — Other Ambulatory Visit: Payer: Self-pay | Admitting: Family

## 2018-05-13 DIAGNOSIS — J410 Simple chronic bronchitis: Secondary | ICD-10-CM | POA: Diagnosis not present

## 2018-05-28 ENCOUNTER — Other Ambulatory Visit: Payer: Self-pay | Admitting: Family

## 2018-05-28 DIAGNOSIS — I10 Essential (primary) hypertension: Secondary | ICD-10-CM

## 2018-06-01 ENCOUNTER — Other Ambulatory Visit: Payer: Self-pay | Admitting: Family

## 2018-06-01 DIAGNOSIS — M159 Polyosteoarthritis, unspecified: Secondary | ICD-10-CM

## 2018-06-01 DIAGNOSIS — F411 Generalized anxiety disorder: Secondary | ICD-10-CM

## 2018-06-01 DIAGNOSIS — F331 Major depressive disorder, recurrent, moderate: Secondary | ICD-10-CM

## 2018-06-01 DIAGNOSIS — M150 Primary generalized (osteo)arthritis: Principal | ICD-10-CM

## 2018-06-13 DIAGNOSIS — J410 Simple chronic bronchitis: Secondary | ICD-10-CM | POA: Diagnosis not present

## 2018-06-30 ENCOUNTER — Other Ambulatory Visit: Payer: Self-pay | Admitting: Family

## 2018-06-30 DIAGNOSIS — Z0289 Encounter for other administrative examinations: Secondary | ICD-10-CM

## 2018-06-30 DIAGNOSIS — F1120 Opioid dependence, uncomplicated: Secondary | ICD-10-CM

## 2018-06-30 DIAGNOSIS — M150 Primary generalized (osteo)arthritis: Secondary | ICD-10-CM

## 2018-06-30 DIAGNOSIS — M159 Polyosteoarthritis, unspecified: Secondary | ICD-10-CM

## 2018-06-30 DIAGNOSIS — M5441 Lumbago with sciatica, right side: Principal | ICD-10-CM

## 2018-06-30 DIAGNOSIS — G8929 Other chronic pain: Secondary | ICD-10-CM

## 2018-06-30 MED ORDER — OXYCODONE-ACETAMINOPHEN 7.5-325 MG PO TABS: 1.0000 | ORAL_TABLET | Freq: Four times a day (QID) | ORAL | 0 refills | Status: DC | PRN

## 2018-07-11 DIAGNOSIS — Z029 Encounter for administrative examinations, unspecified: Secondary | ICD-10-CM

## 2018-07-14 ENCOUNTER — Other Ambulatory Visit: Payer: Self-pay | Admitting: Family

## 2018-07-14 DIAGNOSIS — F331 Major depressive disorder, recurrent, moderate: Secondary | ICD-10-CM

## 2018-07-14 DIAGNOSIS — F411 Generalized anxiety disorder: Secondary | ICD-10-CM

## 2018-07-14 DIAGNOSIS — J410 Simple chronic bronchitis: Secondary | ICD-10-CM | POA: Diagnosis not present

## 2018-07-14 NOTE — Telephone Encounter (Signed)
Last seen 05/02/18

## 2018-07-17 ENCOUNTER — Encounter: Payer: Self-pay | Admitting: Family

## 2018-07-26 ENCOUNTER — Other Ambulatory Visit: Payer: Self-pay | Admitting: Physician Assistant

## 2018-07-26 DIAGNOSIS — I10 Essential (primary) hypertension: Secondary | ICD-10-CM

## 2018-07-29 ENCOUNTER — Other Ambulatory Visit: Payer: Self-pay | Admitting: Family

## 2018-07-31 ENCOUNTER — Ambulatory Visit (INDEPENDENT_AMBULATORY_CARE_PROVIDER_SITE_OTHER): Payer: PPO | Admitting: Family

## 2018-07-31 ENCOUNTER — Encounter: Payer: Self-pay | Admitting: Family

## 2018-07-31 VITALS — BP 129/57 | HR 78 | Temp 97.6°F | Ht 63.0 in | Wt 287.8 lb

## 2018-07-31 DIAGNOSIS — G8929 Other chronic pain: Secondary | ICD-10-CM | POA: Diagnosis not present

## 2018-07-31 DIAGNOSIS — Z0289 Encounter for other administrative examinations: Secondary | ICD-10-CM

## 2018-07-31 DIAGNOSIS — M150 Primary generalized (osteo)arthritis: Secondary | ICD-10-CM

## 2018-07-31 DIAGNOSIS — Z9989 Dependence on other enabling machines and devices: Secondary | ICD-10-CM | POA: Diagnosis not present

## 2018-07-31 DIAGNOSIS — F411 Generalized anxiety disorder: Secondary | ICD-10-CM | POA: Diagnosis not present

## 2018-07-31 DIAGNOSIS — M5441 Lumbago with sciatica, right side: Secondary | ICD-10-CM | POA: Diagnosis not present

## 2018-07-31 DIAGNOSIS — E559 Vitamin D deficiency, unspecified: Secondary | ICD-10-CM

## 2018-07-31 DIAGNOSIS — F331 Major depressive disorder, recurrent, moderate: Secondary | ICD-10-CM

## 2018-07-31 DIAGNOSIS — E782 Mixed hyperlipidemia: Secondary | ICD-10-CM | POA: Diagnosis not present

## 2018-07-31 DIAGNOSIS — G4733 Obstructive sleep apnea (adult) (pediatric): Secondary | ICD-10-CM

## 2018-07-31 DIAGNOSIS — Z6841 Body Mass Index (BMI) 40.0 and over, adult: Secondary | ICD-10-CM | POA: Diagnosis not present

## 2018-07-31 DIAGNOSIS — I10 Essential (primary) hypertension: Secondary | ICD-10-CM | POA: Diagnosis not present

## 2018-07-31 DIAGNOSIS — F1120 Opioid dependence, uncomplicated: Secondary | ICD-10-CM

## 2018-07-31 DIAGNOSIS — J4540 Moderate persistent asthma, uncomplicated: Secondary | ICD-10-CM

## 2018-07-31 DIAGNOSIS — M159 Polyosteoarthritis, unspecified: Secondary | ICD-10-CM

## 2018-07-31 MED ORDER — OXYCODONE-ACETAMINOPHEN 7.5-325 MG PO TABS: 1.0000 | ORAL_TABLET | Freq: Four times a day (QID) | ORAL | 0 refills | Status: DC | PRN

## 2018-07-31 MED ORDER — ALPRAZOLAM 0.5 MG PO TABS: ORAL_TABLET | ORAL | 5 refills | Status: DC

## 2018-07-31 NOTE — Progress Notes (Signed)
Subjective:    Patient ID: Kathleen Saunders, female    DOB: November 16, 1950, 68 y.o.   MRN: [id number]  Chief Complaint  Patient presents with  . Medical Management of Chronic Issues    lab work due   PT presents to the office today for chronic follow up. Pt is followed by Cardiologists for murmur. She had a normal Echo.  She also see's Ortho for bilateral knee pain and back pain.  Hypertension  This is a chronic problem. The current episode started more than 1 year ago. The problem has been resolved since onset. The problem is controlled. Associated symptoms include anxiety, malaise/fatigue and shortness of breath. Pertinent negatives include no chest pain or peripheral edema. Risk factors for coronary artery disease include dyslipidemia, diabetes mellitus, obesity and sedentary lifestyle. The current treatment provides moderate improvement. There is no history of kidney disease, CAD/MI, CVA or heart failure.  Asthma  She complains of shortness of breath and wheezing. There is no cough. This is a chronic problem. The current episode started more than 1 year ago. The problem occurs intermittently. The problem has been waxing and waning. Associated symptoms include malaise/fatigue. Pertinent negatives include no chest pain, ear congestion, ear pain or sore throat. Her past medical history is significant for asthma.  Hyperlipidemia  This is a chronic problem. The current episode started more than 1 year ago. The problem is uncontrolled. Recent lipid tests were reviewed and are high. Exacerbating diseases include obesity. Associated symptoms include shortness of breath. Pertinent negatives include no chest pain. Current antihyperlipidemic treatment includes diet change. The current treatment provides mild improvement of lipids. Risk factors for coronary artery disease include dyslipidemia, diabetes mellitus, hypertension and a sedentary lifestyle.  Anxiety  Presents for follow-up visit. Symptoms include  decreased concentration, excessive worry, irritability, nervous/anxious behavior, restlessness and shortness of breath. Patient reports no chest pain. Symptoms occur most days. The severity of symptoms is moderate. The quality of sleep is good.   Her past medical history is significant for asthma.  Depression         This is a chronic problem.  The current episode started more than 1 year ago.   The onset quality is gradual.   The problem occurs intermittently.  The problem has been waxing and waning since onset.  Associated symptoms include decreased concentration, irritable, restlessness, decreased interest and sad.  Associated symptoms include no helplessness and no hopelessness.  Past treatments include SSRIs - Selective serotonin reuptake inhibitors.  Compliance with treatment is good.  Past medical history includes anxiety.   Back Pain  This is a chronic problem. The current episode started more than 1 year ago. The problem occurs intermittently. The problem has been waxing and waning since onset. The pain is present in the lumbar spine. The quality of the pain is described as aching. The pain is at a severity of 8/10 (up and moving, 0 when sitting). The pain is moderate. Pertinent negatives include no chest pain, dysuria, tingling or weakness. Risk factors include obesity. She has tried analgesics and bed rest for the symptoms. The treatment provided mild relief.  Arthritis  Presents for follow-up visit. She complains of pain, stiffness and joint warmth. The symptoms have been stable. Affected locations include the right knee and left knee (back). Pertinent negatives include no dysuria.  OSA Uses CPAP most of the time. Stable.   Review of Systems  Constitutional: Positive for irritability and malaise/fatigue.  HENT: Negative for ear pain and sore throat.  Respiratory: Positive for shortness of breath and wheezing. Negative for cough.   Cardiovascular: Negative for chest pain.  Genitourinary:  Negative for dysuria.  Musculoskeletal: Positive for arthritis, back pain and stiffness.  Neurological: Negative for tingling and weakness.  Psychiatric/Behavioral: Positive for decreased concentration and depression. The patient is nervous/anxious.   All other systems reviewed and are negative.      Objective:   Physical Exam Vitals signs reviewed.  Constitutional:      General: She is irritable. She is not in acute distress.    Appearance: She is well-developed.  HENT:     Head: Normocephalic and atraumatic.     Right Ear: Tympanic membrane normal.     Left Ear: Tympanic membrane normal.  Eyes:     Pupils: Pupils are equal, round, and reactive to light.  Neck:     Musculoskeletal: Normal range of motion and neck supple.     Thyroid: No thyromegaly.  Cardiovascular:     Rate and Rhythm: Normal rate and regular rhythm.     Heart sounds: Normal heart sounds. No murmur.  Pulmonary:     Effort: Pulmonary effort is normal. No respiratory distress.     Breath sounds: Normal breath sounds. No wheezing.  Abdominal:     General: Bowel sounds are normal. There is no distension.     Palpations: Abdomen is soft.     Tenderness: There is no abdominal tenderness.  Musculoskeletal: Normal range of motion.        General: No tenderness.     Right lower leg: Edema present.     Left lower leg: Edema present.  Skin:    General: Skin is warm and dry.  Neurological:     Mental Status: She is alert and oriented to person, place, and time.     Cranial Nerves: No cranial nerve deficit.     Deep Tendon Reflexes: Reflexes are normal and symmetric.  Psychiatric:        Behavior: Behavior normal.        Thought Content: Thought content normal.        Judgment: Judgment normal.       BP (!) 129/57   Pulse 78   Temp 97.6 F (36.4 C) (Oral)   Ht 5' 3"  (1.6 m)   Wt 287 lb 12.8 oz (130.5 kg)   BMI 50.98 kg/m      Assessment & Plan:  Kathleen Saunders comes in today with chief complaint of  Medical Management of Chronic Issues (lab work due)   Diagnosis and orders addressed:  1. Essential hypertension - CMP14+EGFR - CBC with Differential/Platelet  2. Moderate persistent asthma without complication - OJJ00+XFGH - CBC with Differential/Platelet  3. OSA on CPAP - CMP14+EGFR - CBC with Differential/Platelet  4. Primary osteoarthritis involving multiple joints - CMP14+EGFR - CBC with Differential/Platelet - oxyCODONE-acetaminophen (PERCOCET) 7.5-325 MG tablet; Take 1 tablet by mouth every 6 (six) hours as needed for severe pain.  Dispense: 120 tablet; Refill: 0 - oxyCODONE-acetaminophen (PERCOCET) 7.5-325 MG tablet; Take 1 tablet by mouth every 6 (six) hours as needed for severe pain.  Dispense: 120 tablet; Refill: 0 - oxyCODONE-acetaminophen (PERCOCET) 7.5-325 MG tablet; Take 1 tablet by mouth every 6 (six) hours as needed for severe pain.  Dispense: 120 tablet; Refill: 0 - Ambulatory referral to Orthopedic Surgery  5. Chronic bilateral low back pain with right-sided sciatica - CMP14+EGFR - CBC with Differential/Platelet - oxyCODONE-acetaminophen (PERCOCET) 7.5-325 MG tablet; Take 1 tablet by mouth every 6 (  six) hours as needed for severe pain.  Dispense: 120 tablet; Refill: 0 - oxyCODONE-acetaminophen (PERCOCET) 7.5-325 MG tablet; Take 1 tablet by mouth every 6 (six) hours as needed for severe pain.  Dispense: 120 tablet; Refill: 0 - oxyCODONE-acetaminophen (PERCOCET) 7.5-325 MG tablet; Take 1 tablet by mouth every 6 (six) hours as needed for severe pain.  Dispense: 120 tablet; Refill: 0  6. Moderate episode of recurrent major depressive disorder (HCC) - CMP14+EGFR - CBC with Differential/Platelet - ALPRAZolam (XANAX) 0.5 MG tablet; TAKE 1 TABLET(0.5 MG) BY MOUTH AT BEDTIME AS NEEDED FOR ANXIETY  Dispense: 30 tablet; Refill: 5  7. GAD (generalized anxiety disorder) - CMP14+EGFR - CBC with Differential/Platelet - ALPRAZolam (XANAX) 0.5 MG tablet; TAKE 1 TABLET(0.5  MG) BY MOUTH AT BEDTIME AS NEEDED FOR ANXIETY  Dispense: 30 tablet; Refill: 5  8. Mixed hyperlipidemia - CMP14+EGFR - CBC with Differential/Platelet - Lipid panel  9. Morbid obesity with BMI of 50.0-59.9, adult (HCC) - CMP14+EGFR - CBC with Differential/Platelet - ToxASSURE Select 13 (MW), Urine  10. Uncomplicated opioid dependence ([place]) - CMP14+EGFR - CBC with Differential/Platelet - ToxASSURE Select 13 (MW), Urine - oxyCODONE-acetaminophen (PERCOCET) 7.5-325 MG tablet; Take 1 tablet by mouth every 6 (six) hours as needed for severe pain.  Dispense: 120 tablet; Refill: 0 - oxyCODONE-acetaminophen (PERCOCET) 7.5-325 MG tablet; Take 1 tablet by mouth every 6 (six) hours as needed for severe pain.  Dispense: 120 tablet; Refill: 0 - oxyCODONE-acetaminophen (PERCOCET) 7.5-325 MG tablet; Take 1 tablet by mouth every 6 (six) hours as needed for severe pain.  Dispense: 120 tablet; Refill: 0  11. Pain medication agreement signed  - CMP14+EGFR - CBC with Differential/Platelet - ToxASSURE Select 13 (MW), Urine - oxyCODONE-acetaminophen (PERCOCET) 7.5-325 MG tablet; Take 1 tablet by mouth every 6 (six) hours as needed for severe pain.  Dispense: 120 tablet; Refill: 0 - oxyCODONE-acetaminophen (PERCOCET) 7.5-325 MG tablet; Take 1 tablet by mouth every 6 (six) hours as needed for severe pain.  Dispense: 120 tablet; Refill: 0 - oxyCODONE-acetaminophen (PERCOCET) 7.5-325 MG tablet; Take 1 tablet by mouth every 6 (six) hours as needed for severe pain.  Dispense: 120 tablet; Refill: 0  12. Vitamin D deficiency - CMP14+EGFR - CBC with Differential/Platelet - VITAMIN D 25 Hydroxy (Vit-D Deficiency, Fractures)   Labs pending Pt reviewed in  controlled database, no red flags  Health Maintenance reviewed Diet and exercise encouraged  Follow up plan: 3 months and referral to Ortho pending   Evelina Dun, FNP

## 2018-07-31 NOTE — Patient Instructions (Signed)

## 2018-08-01 ENCOUNTER — Other Ambulatory Visit: Payer: PPO

## 2018-08-01 ENCOUNTER — Other Ambulatory Visit: Payer: Self-pay | Admitting: Family

## 2018-08-01 DIAGNOSIS — D72829 Elevated white blood cell count, unspecified: Secondary | ICD-10-CM

## 2018-08-01 LAB — CBC WITH DIFFERENTIAL/PLATELET
Basophils Absolute: 0.1 10*3/uL (ref 0.0–0.2)
Basos: 1 %
EOS (ABSOLUTE): 0.2 10*3/uL (ref 0.0–0.4)
Eos: 1 %
HEMOGLOBIN: 10.8 g/dL — AB (ref 11.1–15.9)
Hematocrit: 35.2 % (ref 34.0–46.6)
Immature Grans (Abs): 0.1 10*3/uL (ref 0.0–0.1)
Immature Granulocytes: 1 %
LYMPHS: 24 %
Lymphocytes Absolute: 4.1 10*3/uL — ABNORMAL HIGH (ref 0.7–3.1)
MCH: 25.7 pg — ABNORMAL LOW (ref 26.6–33.0)
MCHC: 30.7 g/dL — ABNORMAL LOW (ref 31.5–35.7)
MCV: 84 fL (ref 79–97)
MONOCYTES: 7 %
Monocytes Absolute: 1.1 10*3/uL — ABNORMAL HIGH (ref 0.1–0.9)
Neutrophils Absolute: 11.5 10*3/uL — ABNORMAL HIGH (ref 1.4–7.0)
Neutrophils: 66 %
Platelets: 441 10*3/uL (ref 150–450)
RBC: 4.21 x10E6/uL (ref 3.77–5.28)
RDW: 15.3 % (ref 11.7–15.4)
WBC: 17.3 10*3/uL — ABNORMAL HIGH (ref 3.4–10.8)

## 2018-08-01 LAB — CMP14+EGFR
ALBUMIN: 4.3 g/dL (ref 3.8–4.8)
ALT: 14 IU/L (ref 0–32)
AST: 15 IU/L (ref 0–40)
Albumin/Globulin Ratio: 1.7 (ref 1.2–2.2)
Alkaline Phosphatase: 89 IU/L (ref 39–117)
BUN / CREAT RATIO: 24 (ref 12–28)
BUN: 31 mg/dL — ABNORMAL HIGH (ref 8–27)
Bilirubin Total: <0.2 mg/dL (ref 0.0–1.2)
CALCIUM: 9.9 mg/dL (ref 8.7–10.3)
CO2: 21 mmol/L (ref 20–29)
Chloride: 101 mmol/L (ref 96–106)
Creatinine, Ser: 1.29 mg/dL — ABNORMAL HIGH (ref 0.57–1.00)
GFR calc Af Amer: 50 mL/min/{1.73_m2} — ABNORMAL LOW (ref >59)
GFR calc non Af Amer: 43 mL/min/{1.73_m2} — ABNORMAL LOW (ref >59)
Globulin, Total: 2.5 g/dL (ref 1.5–4.5)
Glucose: 105 mg/dL — ABNORMAL HIGH (ref 65–99)
Potassium: 5 mmol/L (ref 3.5–5.2)
Sodium: 142 mmol/L (ref 134–144)
Total Protein: 6.8 g/dL (ref 6.0–8.5)

## 2018-08-01 LAB — LIPID PANEL
Chol/HDL Ratio: 5 ratio — ABNORMAL HIGH (ref 0.0–4.4)
Cholesterol, Total: 281 mg/dL — ABNORMAL HIGH (ref 100–199)
HDL: 56 mg/dL (ref >39)
LDL Calculated: 177 mg/dL — ABNORMAL HIGH (ref 0–99)
Triglycerides: 240 mg/dL — ABNORMAL HIGH (ref 0–149)
VLDL Cholesterol Cal: 48 mg/dL — ABNORMAL HIGH (ref 5–40)

## 2018-08-01 LAB — VITAMIN D 25 HYDROXY (VIT D DEFICIENCY, FRACTURES): Vit D, 25-Hydroxy: 23.8 ng/mL — ABNORMAL LOW (ref 30.0–100.0)

## 2018-08-01 MED ORDER — VITAMIN D (ERGOCALCIFEROL) 1.25 MG (50000 UNIT) PO CAPS: ORAL_CAPSULE | ORAL | 0 refills | Status: DC

## 2018-08-03 ENCOUNTER — Encounter: Payer: Self-pay | Admitting: *Deleted

## 2018-08-03 DIAGNOSIS — Z6841 Body Mass Index (BMI) 40.0 and over, adult: Secondary | ICD-10-CM | POA: Diagnosis not present

## 2018-08-03 DIAGNOSIS — M170 Bilateral primary osteoarthritis of knee: Secondary | ICD-10-CM | POA: Diagnosis not present

## 2018-08-04 LAB — TOXASSURE SELECT 13 (MW), URINE

## 2018-08-11 ENCOUNTER — Other Ambulatory Visit: Payer: Self-pay | Admitting: Family

## 2018-08-11 DIAGNOSIS — F411 Generalized anxiety disorder: Secondary | ICD-10-CM

## 2018-08-11 DIAGNOSIS — F331 Major depressive disorder, recurrent, moderate: Secondary | ICD-10-CM

## 2018-08-14 ENCOUNTER — Other Ambulatory Visit (INDEPENDENT_AMBULATORY_CARE_PROVIDER_SITE_OTHER): Payer: PPO

## 2018-08-14 DIAGNOSIS — D72829 Elevated white blood cell count, unspecified: Secondary | ICD-10-CM

## 2018-08-15 ENCOUNTER — Other Ambulatory Visit: Payer: Self-pay | Admitting: *Deleted

## 2018-08-15 ENCOUNTER — Other Ambulatory Visit: Payer: Self-pay | Admitting: Family

## 2018-08-15 DIAGNOSIS — D72829 Elevated white blood cell count, unspecified: Secondary | ICD-10-CM

## 2018-08-15 DIAGNOSIS — M150 Primary generalized (osteo)arthritis: Secondary | ICD-10-CM

## 2018-08-15 DIAGNOSIS — G8929 Other chronic pain: Secondary | ICD-10-CM

## 2018-08-15 DIAGNOSIS — M5441 Lumbago with sciatica, right side: Principal | ICD-10-CM

## 2018-08-15 DIAGNOSIS — M159 Polyosteoarthritis, unspecified: Secondary | ICD-10-CM

## 2018-08-15 DIAGNOSIS — Z0289 Encounter for other administrative examinations: Secondary | ICD-10-CM

## 2018-08-15 DIAGNOSIS — F1120 Opioid dependence, uncomplicated: Secondary | ICD-10-CM

## 2018-08-16 ENCOUNTER — Other Ambulatory Visit: Payer: Self-pay

## 2018-08-16 ENCOUNTER — Other Ambulatory Visit: Payer: PPO

## 2018-08-16 DIAGNOSIS — D72829 Elevated white blood cell count, unspecified: Secondary | ICD-10-CM

## 2018-08-16 LAB — CBC WITH DIFFERENTIAL/PLATELET
BASOS ABS: 0.1 10*3/uL (ref 0.0–0.2)
Basos: 1 %
EOS (ABSOLUTE): 0.3 10*3/uL (ref 0.0–0.4)
Eos: 2 %
Hematocrit: 29.6 % — ABNORMAL LOW (ref 34.0–46.6)
Hemoglobin: 9.8 g/dL — ABNORMAL LOW (ref 11.1–15.9)
IMMATURE GRANS (ABS): 0.2 10*3/uL — AB (ref 0.0–0.1)
Immature Granulocytes: 1 %
Lymphocytes Absolute: 2.5 10*3/uL (ref 0.7–3.1)
Lymphs: 19 %
MCH: 27.5 pg (ref 26.6–33.0)
MCHC: 33.1 g/dL (ref 31.5–35.7)
MCV: 83 fL (ref 79–97)
Monocytes Absolute: 1.1 10*3/uL — ABNORMAL HIGH (ref 0.1–0.9)
Monocytes: 9 %
Neutrophils Absolute: 8.8 10*3/uL — ABNORMAL HIGH (ref 1.4–7.0)
Neutrophils: 68 %
Platelets: 388 10*3/uL (ref 150–450)
RBC: 3.57 x10E6/uL — ABNORMAL LOW (ref 3.77–5.28)
RDW: 15.4 % (ref 11.7–15.4)
WBC: 12.9 10*3/uL — ABNORMAL HIGH (ref 3.4–10.8)

## 2018-08-22 ENCOUNTER — Other Ambulatory Visit: Payer: Self-pay | Admitting: Family

## 2018-08-22 MED ORDER — VALACYCLOVIR HCL 1 G PO TABS: 1000.0000 mg | ORAL_TABLET | Freq: Three times a day (TID) | ORAL | 0 refills | Status: AC

## 2018-08-22 NOTE — Progress Notes (Signed)
Pt's husband diagnosed with shingles last week. Pt noticed blister like rash on side of face today. Will send in Valtrex to her pharmacy.

## 2018-08-27 ENCOUNTER — Other Ambulatory Visit: Payer: Self-pay | Admitting: Family

## 2018-08-27 DIAGNOSIS — M150 Primary generalized (osteo)arthritis: Principal | ICD-10-CM

## 2018-08-27 DIAGNOSIS — M159 Polyosteoarthritis, unspecified: Secondary | ICD-10-CM

## 2018-08-28 NOTE — Telephone Encounter (Signed)
Last seen 07/31/2018 

## 2018-09-26 ENCOUNTER — Other Ambulatory Visit: Payer: Self-pay

## 2018-09-26 ENCOUNTER — Encounter: Payer: Self-pay | Admitting: Family Medicine

## 2018-09-26 ENCOUNTER — Ambulatory Visit (INDEPENDENT_AMBULATORY_CARE_PROVIDER_SITE_OTHER): Payer: PPO | Admitting: Family Medicine

## 2018-09-26 DIAGNOSIS — M5432 Sciatica, left side: Secondary | ICD-10-CM

## 2018-09-26 DIAGNOSIS — N3090 Cystitis, unspecified without hematuria: Secondary | ICD-10-CM

## 2018-09-26 MED ORDER — CIPROFLOXACIN HCL 500 MG PO TABS: 500.0000 mg | ORAL_TABLET | Freq: Two times a day (BID) | ORAL | 0 refills | Status: AC

## 2018-09-26 MED ORDER — PREDNISONE 20 MG PO TABS: ORAL_TABLET | ORAL | 0 refills | Status: DC

## 2018-09-26 NOTE — Progress Notes (Signed)
Virtual Visit via telephone Note Due to COVID-19, visit is conducted virtually and was requested by patient. This visit type was conducted due to national recommendations for restrictions regarding the COVID-19 Pandemic (e.g. social distancing) in an effort to limit this patient's exposure and mitigate transmission in our community.  Due to her co-morbid illnesses, this patient is at least at moderate risk for complications without adequate follow up.  This format is felt to be most appropriate for this patient at this time.  All issues noted in this document were discussed and addressed.  A physical exam was not performed with this format.   I connected with Kathleen ChimesWanda G Sayres on 09/26/18 at 1530 by telephone and verified that I am speaking with the correct person using two identifiers. Kathleen Saunders is currently located at home and family is currently with them during visit. The provider, Kari BaarsMichelle Panayiotis [place], FNP is located in their office at time of visit.  I discussed the limitations, risks, security and privacy concerns of performing an evaluation and management service by telephone and the availability of in person appointments. I also discussed with the patient that there may be a patient responsible charge related to this service. The patient expressed understanding and agreed to proceed.  Subjective:  Patient ID: Kathleen ChimesWanda G Grantz, female    DOB: 30-Dec-1950, 68 y.o.   MRN: [id number]  Chief Complaint:  Urinary Tract Infection and Leg Pain   HPI: Kathleen ChimesWanda G Diebold is a 68 y.o. female presenting on 09/26/2018 for Urinary Tract Infection and Leg Pain   Pt states she thinks she may have an urinary tract infection. States she has had dark colored urine, dysuria, lower abdominal pressure, and lower back pressure. States this started about 1 week ago. States she has not tired anything for the urinary symptoms. She has had low grade fever with the symptoms. No confusion, weakness, or fatigue. No decrease in  urinary output. States she has also caused an increase in her sciatica pain. States she has noticed an increase in the lower back pain with radiation into her left leg. She states she is on pain medications for the pain but they are not helping at this time. The pain is harp and shooting, 8/10 at worst. She denies loss of function, weakness, saddle anesthesia, or changes in bowel or bladder function.    Relevant past medical, surgical, family, and social history reviewed and updated as indicated.  Allergies and medications reviewed and updated.   Past Medical History:  Diagnosis Date   Anxiety    Arthritis    Depression    Essential hypertension    Hyperlipidemia    OSA (obstructive sleep apnea)     Past Surgical History:  Procedure Laterality Date   ABDOMINAL HYSTERECTOMY     CARPAL TUNNEL RELEASE     CHOLECYSTECTOMY     GASTRIC BYPASS     PLANTAR FASCIA RELEASE Right    REPLACEMENT TOTAL KNEE Right     Social History   Socioeconomic History   Marital status: Married    Spouse name: Jomarie LongsJoseph   Number of children: 3   Years of education: Not on file   Highest education level: High school graduate  Occupational History   Occupation: Stylist    Comment: self employeed  Social Network [employer]   Financial resource strain: Not on file   Food insecurity:    Worry: Not on file    Inability: Not on file   Transportation needs:    Medical:  Not on file    Non-medical: Not on file  Tobacco Use   Smoking status: Former Smoker    Types: Cigarettes    Last attempt to quit: 06/29/1987    Years since quitting: 31.2   Smokeless tobacco: Never Used  Substance and Sexual Activity   Alcohol use: No   Drug use: No   Sexual activity: Not on file  Lifestyle   Physical activity:    Days per week: Not on file    Minutes per session: Not on file   Stress: Not on file  Relationships   Social connections:    Talks on phone: Not on file    Gets together: Not on file     Attends religious service: Not on file    Active member of club or organization: Not on file    Attends meetings of clubs or organizations: Not on file    Relationship status: Not on file   Intimate partner violence:    Fear of current or ex partner: Not on file    Emotionally abused: Not on file    Physically abused: Not on file    Forced sexual activity: Not on file  Other Topics Concern   Not on file  Social History Narrative   Patient is right-handed, married lives with husband in a one story house. She ddrinks 1-2 glasses of tea a day, an occasional soda and rarely coffee. She is limited in her activity due to arthritis. She has a Engineer, drilling.    Outpatient Encounter Medications as of 09/26/2018  Medication Sig   albuterol (PROVENTIL) (2.5 MG/3ML) 0.083% nebulizer solution Take 3 mLs (2.5 mg total) by nebulization every 6 (six) hours as needed for wheezing or shortness of breath.   ALPRAZolam (XANAX) 0.5 MG tablet TAKE 1 TABLET(0.5 MG) BY MOUTH AT BEDTIME AS NEEDED FOR ANXIETY   budesonide-formoterol (SYMBICORT) 80-4.5 MCG/ACT inhaler Inhale 2 puffs into the lungs 2 (two) times daily.   ciprofloxacin (CIPRO) 500 MG tablet Take 1 tablet (500 mg total) by mouth 2 (two) times daily for 7 days.   esomeprazole (NEXIUM) 20 MG capsule TAKE 1 CAPSULE(20 MG) BY MOUTH DAILY   furosemide (LASIX) 20 MG tablet TAKE 1 TABLET(20 MG) BY MOUTH DAILY   lisinopril-hydrochlorothiazide (PRINZIDE,ZESTORETIC) 20-25 MG tablet TAKE 1 TABLET BY MOUTH EVERY DAY   methocarbamol (ROBAXIN) 500 MG tablet TAKE 1 TABLET(500 MG) BY MOUTH EVERY 6 HOURS AS NEEDED FOR MUSCLE SPASMS   naloxegol oxalate (MOVANTIK) 25 MG TABS tablet Take 1 tablet (25 mg total) by mouth daily. (Patient not taking: Reported on 07/31/2018)   oxyCODONE-acetaminophen (PERCOCET) 7.5-325 MG tablet Take 1 tablet by mouth every 6 (six) hours as needed for severe pain.   oxyCODONE-acetaminophen (PERCOCET) 7.5-325 MG tablet Take  1 tablet by mouth every 6 (six) hours as needed for severe pain.   oxyCODONE-acetaminophen (PERCOCET) 7.5-325 MG tablet Take 1 tablet by mouth every 6 (six) hours as needed for severe pain.   pantoprazole (PROTONIX) 40 MG tablet TAKE 1 TABLET(40 MG) BY MOUTH DAILY   predniSONE (DELTASONE) 20 MG tablet 2 po at sametime daily for 5 days   sertraline (ZOLOFT) 50 MG tablet TAKE 1 TABLET(50 MG) BY MOUTH DAILY   vitamin B-12 (CYANOCOBALAMIN) 500 MCG tablet Take 500 mcg by mouth daily.   Vitamin D, Ergocalciferol, (DRISDOL) 1.25 MG (50000 UT) CAPS capsule TAKE 1 CAPSULE BY MOUTH EVERY 7 DAYS   No facility-administered encounter medications on file as of 09/26/2018.  Allergies  Allergen Reactions   Simvastatin Other (See Comments)    Body aches    Review of Systems  Constitutional: Positive for fever. Negative for activity change, appetite change, chills and fatigue.  HENT: Negative for congestion and sore throat.   Respiratory: Negative for cough and shortness of breath.   Cardiovascular: Negative for chest pain, palpitations and leg swelling.  Gastrointestinal: Positive for abdominal pain. Negative for constipation, diarrhea, nausea and vomiting.  Genitourinary: Positive for dysuria, frequency and urgency. Negative for decreased urine volume, difficulty urinating, dyspareunia, enuresis, flank pain, genital sores, hematuria, menstrual problem, pelvic pain, vaginal bleeding, vaginal discharge and vaginal pain.  Musculoskeletal: Positive for back pain. Negative for arthralgias, gait problem, joint swelling, myalgias, neck pain and neck stiffness.  Neurological: Negative for dizziness, weakness, light-headedness, numbness and headaches.  Psychiatric/Behavioral: Negative for confusion.  All other systems reviewed and are negative.        Observations/Objective: No vital signs or physical exam, this was a telephone or virtual health encounter.  Pt alert and oriented, answers all  questions appropriately, and able to speak in full sentences.    Assessment and Plan: Mahalie was seen today for urinary tract infection and leg pain.  Diagnoses and all orders for this visit:  Cystitis Likely UTI based on reported symptoms. Symptomatic care discussed. Increase water intake. Avoid bladder irritants. Report any new or worsening symptoms. Medications as prescribed.  -     ciprofloxacin (CIPRO) 500 MG tablet; Take 1 tablet (500 mg total) by mouth 2 (two) times daily for 7 days.  Sciatica of left side Continue prescribed pain medications. Will add prednisone. Report any new or worsening symptoms. Symptomatic care discussed.  -     predniSONE (DELTASONE) 20 MG tablet; 2 po at sametime daily for 5 days     Follow Up Instructions: Return if symptoms worsen or fail to improve.    I discussed the assessment and treatment plan with the patient. The patient was provided an opportunity to ask questions and all were answered. The patient agreed with the plan and demonstrated an understanding of the instructions.   The patient was advised to call back or seek an in-person evaluation if the symptoms worsen or if the condition fails to improve as anticipated.  The above assessment and management plan was discussed with the patient. The patient verbalized understanding of and has agreed to the management plan. Patient is aware to call the clinic if symptoms persist or worsen. Patient is aware when to return to the clinic for a follow-up visit. Patient educated on when it is appropriate to go to the emergency department.    I provided 15 minutes of non-face-to-face time during this encounter. The call started at 1530. The call ended at 1545.   Kari Baars, FNP-C [facility] [address] [place], [place] [address] [phone]

## 2018-09-27 ENCOUNTER — Other Ambulatory Visit: Payer: Self-pay | Admitting: Family

## 2018-09-27 DIAGNOSIS — M159 Polyosteoarthritis, unspecified: Secondary | ICD-10-CM

## 2018-09-27 DIAGNOSIS — M150 Primary generalized (osteo)arthritis: Principal | ICD-10-CM

## 2018-10-23 ENCOUNTER — Other Ambulatory Visit: Payer: Self-pay | Admitting: Family

## 2018-10-26 ENCOUNTER — Other Ambulatory Visit: Payer: Self-pay | Admitting: Family Medicine

## 2018-10-26 DIAGNOSIS — M159 Polyosteoarthritis, unspecified: Secondary | ICD-10-CM

## 2018-10-27 NOTE — Telephone Encounter (Signed)
Last office visit 09/26/2018

## 2018-10-31 ENCOUNTER — Telehealth: Payer: Self-pay | Admitting: Family

## 2018-10-31 DIAGNOSIS — F1120 Opioid dependence, uncomplicated: Secondary | ICD-10-CM

## 2018-10-31 DIAGNOSIS — G8929 Other chronic pain: Secondary | ICD-10-CM

## 2018-10-31 DIAGNOSIS — Z0289 Encounter for other administrative examinations: Secondary | ICD-10-CM

## 2018-10-31 DIAGNOSIS — M159 Polyosteoarthritis, unspecified: Secondary | ICD-10-CM

## 2018-10-31 MED ORDER — OXYCODONE-ACETAMINOPHEN 7.5-325 MG PO TABS: 1.0000 | ORAL_TABLET | Freq: Four times a day (QID) | ORAL | 0 refills | Status: DC | PRN

## 2018-10-31 NOTE — Telephone Encounter (Signed)
Prescription sent to pharmacy. Keep follow up appt.

## 2018-10-31 NOTE — Telephone Encounter (Signed)
What is the name of the medication? OXYCODONE  Have you contacted your pharmacy to request a refill? YES  Which pharmacy would you like this sent to? [facility] was told to call office    Patient notified that their request is being sent to the clinical staff for review and that they should receive a call once it is complete. If they do not receive a call within 24 hours they can check with their pharmacy or our office.

## 2018-11-01 NOTE — Telephone Encounter (Signed)
[facility] sent rx for patient

## 2018-11-02 ENCOUNTER — Ambulatory Visit: Payer: PPO

## 2018-11-03 ENCOUNTER — Telehealth: Payer: Self-pay | Admitting: Family

## 2018-11-06 ENCOUNTER — Other Ambulatory Visit: Payer: Self-pay

## 2018-11-06 ENCOUNTER — Encounter: Payer: Self-pay | Admitting: Family

## 2018-11-06 ENCOUNTER — Ambulatory Visit (INDEPENDENT_AMBULATORY_CARE_PROVIDER_SITE_OTHER): Payer: PPO | Admitting: Family

## 2018-11-06 VITALS — BP 124/65 | HR 65 | Temp 97.3°F | Ht 63.0 in | Wt 286.0 lb

## 2018-11-06 DIAGNOSIS — Z0289 Encounter for other administrative examinations: Secondary | ICD-10-CM

## 2018-11-06 DIAGNOSIS — M5441 Lumbago with sciatica, right side: Secondary | ICD-10-CM

## 2018-11-06 DIAGNOSIS — F331 Major depressive disorder, recurrent, moderate: Secondary | ICD-10-CM

## 2018-11-06 DIAGNOSIS — F411 Generalized anxiety disorder: Secondary | ICD-10-CM

## 2018-11-06 DIAGNOSIS — M159 Polyosteoarthritis, unspecified: Secondary | ICD-10-CM

## 2018-11-06 DIAGNOSIS — F1120 Opioid dependence, uncomplicated: Secondary | ICD-10-CM | POA: Diagnosis not present

## 2018-11-06 DIAGNOSIS — E782 Mixed hyperlipidemia: Secondary | ICD-10-CM | POA: Diagnosis not present

## 2018-11-06 DIAGNOSIS — I10 Essential (primary) hypertension: Secondary | ICD-10-CM | POA: Diagnosis not present

## 2018-11-06 DIAGNOSIS — G4733 Obstructive sleep apnea (adult) (pediatric): Secondary | ICD-10-CM | POA: Diagnosis not present

## 2018-11-06 DIAGNOSIS — G8929 Other chronic pain: Secondary | ICD-10-CM

## 2018-11-06 DIAGNOSIS — M150 Primary generalized (osteo)arthritis: Secondary | ICD-10-CM

## 2018-11-06 DIAGNOSIS — Z6841 Body Mass Index (BMI) 40.0 and over, adult: Secondary | ICD-10-CM | POA: Diagnosis not present

## 2018-11-06 DIAGNOSIS — J4540 Moderate persistent asthma, uncomplicated: Secondary | ICD-10-CM | POA: Diagnosis not present

## 2018-11-06 DIAGNOSIS — Z9989 Dependence on other enabling machines and devices: Secondary | ICD-10-CM

## 2018-11-06 MED ORDER — OXYCODONE-ACETAMINOPHEN 7.5-325 MG PO TABS: 1.0000 | ORAL_TABLET | Freq: Four times a day (QID) | ORAL | 0 refills | Status: DC | PRN

## 2018-11-06 MED ORDER — HYDROXYZINE PAMOATE 50 MG PO CAPS: 50.0000 mg | ORAL_CAPSULE | Freq: Three times a day (TID) | ORAL | 2 refills | Status: DC | PRN

## 2018-11-06 NOTE — Patient Instructions (Signed)

## 2018-11-06 NOTE — Progress Notes (Signed)
Subjective:    Patient ID: Kathleen Saunders, female    DOB: 05-Jan-1951, 68 y.o.   MRN: [id number]  Chief Complaint  Patient presents with  . Medical Management of Chronic Issues   PT presents to the office today for chronic follow up. She also see's Ortho for bilateral knee pain and back pain. Arthritis  Presents for follow-up visit. She complains of pain, stiffness and joint warmth. Affected locations include the left knee and right knee (back). Her pain is at a severity of 7/10.  Asthma  She complains of wheezing. There is no cough. This is a chronic problem. The current episode started more than 1 year ago. The problem occurs intermittently. The problem has been waxing and waning. Associated symptoms include dyspnea on exertion. Pertinent negatives include no headaches, malaise/fatigue or nasal congestion. She reports moderate improvement on treatment. Her past medical history is significant for asthma.  Hypertension  This is a chronic problem. The current episode started more than 1 year ago. Associated symptoms include anxiety. Pertinent negatives include no headaches, malaise/fatigue or peripheral edema. Risk factors for coronary artery disease include dyslipidemia, diabetes mellitus, obesity and sedentary lifestyle. The current treatment provides moderate improvement. There is no history of kidney disease, CVA or heart failure.  Hyperlipidemia  This is a chronic problem. The current episode started more than 1 year ago. The problem is uncontrolled. Recent lipid tests were reviewed and are high. Exacerbating diseases include obesity. Current antihyperlipidemic treatment includes diet change. The current treatment provides mild improvement of lipids. Risk factors for coronary artery disease include dyslipidemia, diabetes mellitus, hypertension, post-menopausal and a sedentary lifestyle.  Depression         This is a chronic problem.  The current episode started more than 1 year ago.   The  onset quality is gradual.   The problem occurs intermittently.  The problem has been waxing and waning since onset.  Associated symptoms include decreased concentration, irritable, restlessness and sad.  Associated symptoms include no helplessness, no hopelessness and no headaches.  Past treatments include SSRIs - Selective serotonin reuptake inhibitors.  Compliance with treatment is good.  Past medical history includes anxiety.   Anxiety  Presents for follow-up visit. Symptoms include decreased concentration, depressed mood, excessive worry, irritability and restlessness. Symptoms occur occasionally. The severity of symptoms is moderate. The quality of sleep is good.   Her past medical history is significant for asthma.  OSA Uses CPAP almost every night.      Review of Systems  Constitutional: Positive for irritability. Negative for malaise/fatigue.  Respiratory: Positive for wheezing. Negative for cough.   Cardiovascular: Positive for dyspnea on exertion.  Musculoskeletal: Positive for arthritis and stiffness.  Neurological: Negative for headaches.  Psychiatric/Behavioral: Positive for decreased concentration and depression.  All other systems reviewed and are negative.      Objective:   Physical Exam Vitals signs reviewed.  Constitutional:      General: She is irritable. She is not in acute distress.    Appearance: She is well-developed. She is obese.  HENT:     Head: Normocephalic and atraumatic.     Right Ear: Tympanic membrane normal.     Left Ear: Tympanic membrane normal.  Eyes:     Pupils: Pupils are equal, round, and reactive to light.  Neck:     Musculoskeletal: Normal range of motion and neck supple.     Thyroid: No thyromegaly.  Cardiovascular:     Rate and Rhythm: Normal rate and regular  rhythm.     Heart sounds: Normal heart sounds. No murmur.  Pulmonary:     Effort: Pulmonary effort is normal. No respiratory distress.     Breath sounds: Normal breath sounds.  No wheezing.  Abdominal:     General: Bowel sounds are normal. There is no distension.     Palpations: Abdomen is soft.     Tenderness: There is no abdominal tenderness.  Musculoskeletal:        General: No tenderness.     Comments: Lower lumbar pain with flexion and extension  Skin:    General: Skin is warm and dry.  Neurological:     Mental Status: She is alert and oriented to person, place, and time.     Cranial Nerves: No cranial nerve deficit.     Deep Tendon Reflexes: Reflexes are normal and symmetric.  Psychiatric:        Behavior: Behavior normal.        Thought Content: Thought content normal.        Judgment: Judgment normal.     BP 124/65   Pulse 65   Temp (!) 97.3 F (36.3 C) (Oral)   Ht 5' 3"  (1.6 m)   Wt 286 lb (129.7 kg)   BMI 50.66 kg/m      Assessment & Plan:  ERNESTYNE CALDWELL comes in today with chief complaint of Medical Management of Chronic Issues   Diagnosis and orders addressed:  1. Chronic bilateral low back pain with right-sided sciatica - oxyCODONE-acetaminophen (PERCOCET) 7.5-325 MG tablet; Take 1 tablet by mouth every 6 (six) hours as needed for severe pain.  Dispense: 120 tablet; Refill: 0 - oxyCODONE-acetaminophen (PERCOCET) 7.5-325 MG tablet; Take 1 tablet by mouth every 6 (six) hours as needed for severe pain.  Dispense: 120 tablet; Refill: 0 - oxyCODONE-acetaminophen (PERCOCET) 7.5-325 MG tablet; Take 1 tablet by mouth every 6 (six) hours as needed for severe pain.  Dispense: 120 tablet; Refill: 0 - CBC with Differential/Platelet - CMP14+EGFR  2. Moderate persistent asthma without complication - CBC with Differential/Platelet - CMP14+EGFR  3. Moderate episode of recurrent major depressive disorder (HCC) - CBC with Differential/Platelet - CMP14+EGFR  4. GAD (generalized anxiety disorder) - CBC with Differential/Platelet - CMP14+EGFR - hydrOXYzine (VISTARIL) 50 MG capsule; Take 1 capsule (50 mg total) by mouth 3 (three) times daily as  needed.  Dispense: 90 capsule; Refill: 2  5. Mixed hyperlipidemia - CBC with Differential/Platelet - CMP14+EGFR  6. Essential hypertension - CBC with Differential/Platelet - CMP14+EGFR  7. Morbid obesity with BMI of 50.0-59.9, adult (HCC) - CBC with Differential/Platelet - CMP14+EGFR  8. Uncomplicated opioid dependence ([place]) - oxyCODONE-acetaminophen (PERCOCET) 7.5-325 MG tablet; Take 1 tablet by mouth every 6 (six) hours as needed for severe pain.  Dispense: 120 tablet; Refill: 0 - oxyCODONE-acetaminophen (PERCOCET) 7.5-325 MG tablet; Take 1 tablet by mouth every 6 (six) hours as needed for severe pain.  Dispense: 120 tablet; Refill: 0 - oxyCODONE-acetaminophen (PERCOCET) 7.5-325 MG tablet; Take 1 tablet by mouth every 6 (six) hours as needed for severe pain.  Dispense: 120 tablet; Refill: 0 - CBC with Differential/Platelet - CMP14+EGFR - ToxASSURE Select 13 (MW), Urine  9. Primary osteoarthritis involving multiple joints - oxyCODONE-acetaminophen (PERCOCET) 7.5-325 MG tablet; Take 1 tablet by mouth every 6 (six) hours as needed for severe pain.  Dispense: 120 tablet; Refill: 0 - oxyCODONE-acetaminophen (PERCOCET) 7.5-325 MG tablet; Take 1 tablet by mouth every 6 (six) hours as needed for severe pain.  Dispense: 120 tablet; Refill:  0 - oxyCODONE-acetaminophen (PERCOCET) 7.5-325 MG tablet; Take 1 tablet by mouth every 6 (six) hours as needed for severe pain.  Dispense: 120 tablet; Refill: 0 - CBC with Differential/Platelet - CMP14+EGFR  10. Pain medication agreement signed - oxyCODONE-acetaminophen (PERCOCET) 7.5-325 MG tablet; Take 1 tablet by mouth every 6 (six) hours as needed for severe pain.  Dispense: 120 tablet; Refill: 0 - oxyCODONE-acetaminophen (PERCOCET) 7.5-325 MG tablet; Take 1 tablet by mouth every 6 (six) hours as needed for severe pain.  Dispense: 120 tablet; Refill: 0 - oxyCODONE-acetaminophen (PERCOCET) 7.5-325 MG tablet; Take 1 tablet by mouth every 6 (six) hours as  needed for severe pain.  Dispense: 120 tablet; Refill: 0 - CBC with Differential/Platelet - CMP14+EGFR - ToxASSURE Select 13 (MW), Urine  11. OSA on CPAP - CBC with Differential/Platelet - CMP14+EGFR  PT reviewed in [place] controlled database Long discussion about xanax and oxycodone together. She is willing to start tampering down xanax. Will add Vistaril 50 mg as needed.   Labs pending Health Maintenance reviewed Diet and exercise encouraged  Follow up plan: 3 months    Evelina Dun, FNP

## 2018-11-07 LAB — CBC WITH DIFFERENTIAL/PLATELET
Basophils Absolute: 0.1 10*3/uL (ref 0.0–0.2)
Basos: 1 %
EOS (ABSOLUTE): 0.3 10*3/uL (ref 0.0–0.4)
Eos: 2 %
Hematocrit: 32.5 % — ABNORMAL LOW (ref 34.0–46.6)
Hemoglobin: 10.5 g/dL — ABNORMAL LOW (ref 11.1–15.9)
Immature Grans (Abs): 0 10*3/uL (ref 0.0–0.1)
Immature Granulocytes: 0 %
Lymphocytes Absolute: 3.1 10*3/uL (ref 0.7–3.1)
Lymphs: 23 %
MCH: 28.1 pg (ref 26.6–33.0)
MCHC: 32.3 g/dL (ref 31.5–35.7)
MCV: 87 fL (ref 79–97)
Monocytes Absolute: 0.9 10*3/uL (ref 0.1–0.9)
Monocytes: 6 %
Neutrophils Absolute: 9 10*3/uL — ABNORMAL HIGH (ref 1.4–7.0)
Neutrophils: 68 %
Platelets: 417 10*3/uL (ref 150–450)
RBC: 3.74 x10E6/uL — ABNORMAL LOW (ref 3.77–5.28)
RDW: 14.9 % (ref 11.7–15.4)
WBC: 13.5 10*3/uL — ABNORMAL HIGH (ref 3.4–10.8)

## 2018-11-07 LAB — CMP14+EGFR
ALT: 14 IU/L (ref 0–32)
AST: 13 IU/L (ref 0–40)
Albumin/Globulin Ratio: 1.8 (ref 1.2–2.2)
Albumin: 4.2 g/dL (ref 3.8–4.8)
Alkaline Phosphatase: 74 IU/L (ref 39–117)
BUN/Creatinine Ratio: 38 — ABNORMAL HIGH (ref 12–28)
BUN: 49 mg/dL — ABNORMAL HIGH (ref 8–27)
Bilirubin Total: <0.2 mg/dL (ref 0.0–1.2)
CO2: 22 mmol/L (ref 20–29)
Calcium: 10.1 mg/dL (ref 8.7–10.3)
Chloride: 99 mmol/L (ref 96–106)
Creatinine, Ser: 1.28 mg/dL — ABNORMAL HIGH (ref 0.57–1.00)
GFR calc Af Amer: 50 mL/min/{1.73_m2} — ABNORMAL LOW (ref >59)
GFR calc non Af Amer: 44 mL/min/{1.73_m2} — ABNORMAL LOW (ref >59)
Globulin, Total: 2.3 g/dL (ref 1.5–4.5)
Glucose: 95 mg/dL (ref 65–99)
Potassium: 5.8 mmol/L — ABNORMAL HIGH (ref 3.5–5.2)
Sodium: 138 mmol/L (ref 134–144)
Total Protein: 6.5 g/dL (ref 6.0–8.5)

## 2018-11-09 ENCOUNTER — Other Ambulatory Visit: Payer: Self-pay | Admitting: Family

## 2018-11-09 DIAGNOSIS — F331 Major depressive disorder, recurrent, moderate: Secondary | ICD-10-CM

## 2018-11-09 DIAGNOSIS — D72829 Elevated white blood cell count, unspecified: Secondary | ICD-10-CM

## 2018-11-09 DIAGNOSIS — F411 Generalized anxiety disorder: Secondary | ICD-10-CM

## 2018-11-09 DIAGNOSIS — E875 Hyperkalemia: Secondary | ICD-10-CM

## 2018-11-10 LAB — TOXASSURE SELECT 13 (MW), URINE

## 2018-11-13 ENCOUNTER — Other Ambulatory Visit: Payer: PPO

## 2018-11-13 ENCOUNTER — Other Ambulatory Visit: Payer: Self-pay

## 2018-11-13 ENCOUNTER — Other Ambulatory Visit: Payer: Self-pay | Admitting: Hematology

## 2018-11-13 DIAGNOSIS — D72829 Elevated white blood cell count, unspecified: Secondary | ICD-10-CM | POA: Insufficient documentation

## 2018-11-13 DIAGNOSIS — D649 Anemia, unspecified: Secondary | ICD-10-CM | POA: Insufficient documentation

## 2018-11-13 DIAGNOSIS — E875 Hyperkalemia: Secondary | ICD-10-CM

## 2018-11-14 ENCOUNTER — Other Ambulatory Visit: Payer: Self-pay | Admitting: Family

## 2018-11-14 LAB — BMP8+EGFR
BUN/Creatinine Ratio: 27 (ref 12–28)
BUN: 50 mg/dL — ABNORMAL HIGH (ref 8–27)
CO2: 18 mmol/L — ABNORMAL LOW (ref 20–29)
Calcium: 9.8 mg/dL (ref 8.7–10.3)
Chloride: 103 mmol/L (ref 96–106)
Creatinine, Ser: 1.87 mg/dL — ABNORMAL HIGH (ref 0.57–1.00)
GFR calc Af Amer: 32 mL/min/{1.73_m2} — ABNORMAL LOW (ref >59)
GFR calc non Af Amer: 28 mL/min/{1.73_m2} — ABNORMAL LOW (ref >59)
Glucose: 129 mg/dL — ABNORMAL HIGH (ref 65–99)
Potassium: 4.9 mmol/L (ref 3.5–5.2)
Sodium: 139 mmol/L (ref 134–144)

## 2018-11-16 ENCOUNTER — Inpatient Hospital Stay: Payer: PPO | Attending: Hematology | Admitting: Hematology

## 2018-11-16 ENCOUNTER — Other Ambulatory Visit: Payer: Self-pay | Admitting: Hematology

## 2018-11-16 ENCOUNTER — Inpatient Hospital Stay: Payer: PPO

## 2018-11-16 DIAGNOSIS — D649 Anemia, unspecified: Secondary | ICD-10-CM

## 2018-11-28 ENCOUNTER — Other Ambulatory Visit: Payer: Self-pay | Admitting: Family Medicine

## 2018-11-28 DIAGNOSIS — M159 Polyosteoarthritis, unspecified: Secondary | ICD-10-CM

## 2018-11-29 NOTE — Telephone Encounter (Signed)
Last filled 10/27/18 #120 no refills

## 2018-12-12 ENCOUNTER — Telehealth: Payer: Self-pay

## 2018-12-12 NOTE — Telephone Encounter (Signed)
[facility] - [facility] spoke to the patient.  Patient declined services.

## 2018-12-15 ENCOUNTER — Encounter: Payer: Self-pay | Admitting: Family

## 2018-12-15 ENCOUNTER — Other Ambulatory Visit: Payer: Self-pay

## 2018-12-15 ENCOUNTER — Ambulatory Visit (INDEPENDENT_AMBULATORY_CARE_PROVIDER_SITE_OTHER): Payer: PPO | Admitting: Family

## 2018-12-15 ENCOUNTER — Other Ambulatory Visit: Payer: Self-pay | Admitting: *Deleted

## 2018-12-15 DIAGNOSIS — M109 Gout, unspecified: Secondary | ICD-10-CM

## 2018-12-15 MED ORDER — COLCHICINE 0.6 MG PO TABS: ORAL_TABLET | ORAL | 1 refills | Status: DC

## 2018-12-15 NOTE — Telephone Encounter (Signed)
Please call in her colchicine, her gout starting flaring up yesterday

## 2018-12-15 NOTE — Telephone Encounter (Signed)
appt scheduled Pt notified of appt

## 2018-12-15 NOTE — Progress Notes (Signed)
   Virtual Visit via telephone Note  I connected with Kathleen Saunders on 12/15/18 at 1:52 pm by telephone and verified that I am speaking with the correct person using two identifiers. Kathleen Saunders is currently located at home and husband  is currently with her during visit. The provider, Evelina Dun, FNP is located in their office at time of visit.  I discussed the limitations, risks, security and privacy concerns of performing an evaluation and management service by telephone and the availability of in person appointments. I also discussed with the patient that there may be a patient responsible charge related to this service. The patient expressed understanding and agreed to proceed.   History and Present Illness:  HPI PT calls the office today with right great toe pain and swelling that started a few days ago, but yesterday started hurting worse. She states she has has had gout in the past, and this feels similar. She reports intermittent aching, sharp pain 2 when sitting, but a 8 out 10 when walking.   She is on oxycodone for chronic back pain that helps.    Review of Systems  Musculoskeletal: Positive for myalgias.  All other systems reviewed and are negative.    Observations/Objective: No SOB or distress noted   Assessment and Plan: 1. Acute gout involving toe of right foot, unspecified cause Low purine diet Force fluids Rest RTO if symptoms worsen or do not improve  - colchicine 0.6 MG tablet; 1.2 mg then one hour later take 0.6 mg if still having pain. Max 1.8 mg/day  Dispense: 20 tablet; Refill: 1     I discussed the assessment and treatment plan with the patient. The patient was provided an opportunity to ask questions and all were answered. The patient agreed with the plan and demonstrated an understanding of the instructions.   The patient was advised to call back or seek an in-person evaluation if the symptoms worsen or if the condition fails to improve as  anticipated.  The above assessment and management plan was discussed with the patient. The patient verbalized understanding of and has agreed to the management plan. Patient is aware to call the clinic if symptoms persist or worsen. Patient is aware when to return to the clinic for a follow-up visit. Patient educated on when it is appropriate to go to the emergency department.   Time call ended: 2:02 pm   I provided 10 minutes of non-face-to-face time during this encounter.    Evelina Dun, FNP

## 2018-12-15 NOTE — Telephone Encounter (Signed)
Can you put her in my schedule as a telephone visit.

## 2018-12-15 NOTE — Telephone Encounter (Signed)
Patient has upcoming appt 

## 2019-01-05 ENCOUNTER — Other Ambulatory Visit: Payer: Self-pay | Admitting: Family

## 2019-01-05 DIAGNOSIS — M159 Polyosteoarthritis, unspecified: Secondary | ICD-10-CM

## 2019-01-21 ENCOUNTER — Other Ambulatory Visit: Payer: Self-pay | Admitting: Family

## 2019-01-21 DIAGNOSIS — I10 Essential (primary) hypertension: Secondary | ICD-10-CM

## 2019-01-22 DIAGNOSIS — M1712 Unilateral primary osteoarthritis, left knee: Secondary | ICD-10-CM | POA: Diagnosis not present

## 2019-01-29 ENCOUNTER — Other Ambulatory Visit: Payer: Self-pay

## 2019-01-29 DIAGNOSIS — M1712 Unilateral primary osteoarthritis, left knee: Secondary | ICD-10-CM | POA: Diagnosis not present

## 2019-01-30 ENCOUNTER — Ambulatory Visit (INDEPENDENT_AMBULATORY_CARE_PROVIDER_SITE_OTHER): Payer: PPO | Admitting: Family

## 2019-01-30 ENCOUNTER — Encounter: Payer: Self-pay | Admitting: Family

## 2019-01-30 VITALS — BP 129/74 | HR 64 | Temp 98.2°F | Ht 63.0 in | Wt 280.0 lb

## 2019-01-30 DIAGNOSIS — G8929 Other chronic pain: Secondary | ICD-10-CM | POA: Diagnosis not present

## 2019-01-30 DIAGNOSIS — Z6841 Body Mass Index (BMI) 40.0 and over, adult: Secondary | ICD-10-CM | POA: Diagnosis not present

## 2019-01-30 DIAGNOSIS — Z9989 Dependence on other enabling machines and devices: Secondary | ICD-10-CM | POA: Diagnosis not present

## 2019-01-30 DIAGNOSIS — F1120 Opioid dependence, uncomplicated: Secondary | ICD-10-CM

## 2019-01-30 DIAGNOSIS — I10 Essential (primary) hypertension: Secondary | ICD-10-CM | POA: Diagnosis not present

## 2019-01-30 DIAGNOSIS — F411 Generalized anxiety disorder: Secondary | ICD-10-CM | POA: Diagnosis not present

## 2019-01-30 DIAGNOSIS — F331 Major depressive disorder, recurrent, moderate: Secondary | ICD-10-CM

## 2019-01-30 DIAGNOSIS — J4540 Moderate persistent asthma, uncomplicated: Secondary | ICD-10-CM | POA: Diagnosis not present

## 2019-01-30 DIAGNOSIS — M150 Primary generalized (osteo)arthritis: Secondary | ICD-10-CM | POA: Diagnosis not present

## 2019-01-30 DIAGNOSIS — G4733 Obstructive sleep apnea (adult) (pediatric): Secondary | ICD-10-CM

## 2019-01-30 DIAGNOSIS — M5441 Lumbago with sciatica, right side: Secondary | ICD-10-CM | POA: Diagnosis not present

## 2019-01-30 DIAGNOSIS — M159 Polyosteoarthritis, unspecified: Secondary | ICD-10-CM

## 2019-01-30 DIAGNOSIS — Z0289 Encounter for other administrative examinations: Secondary | ICD-10-CM

## 2019-01-30 DIAGNOSIS — E782 Mixed hyperlipidemia: Secondary | ICD-10-CM | POA: Diagnosis not present

## 2019-01-30 DIAGNOSIS — D649 Anemia, unspecified: Secondary | ICD-10-CM

## 2019-01-30 DIAGNOSIS — R195 Other fecal abnormalities: Secondary | ICD-10-CM

## 2019-01-30 MED ORDER — OXYCODONE-ACETAMINOPHEN 7.5-325 MG PO TABS: 1.0000 | ORAL_TABLET | Freq: Four times a day (QID) | ORAL | 0 refills | Status: DC | PRN

## 2019-01-30 MED ORDER — METHOCARBAMOL 500 MG PO TABS: ORAL_TABLET | ORAL | 2 refills | Status: DC

## 2019-01-30 MED ORDER — HYDROXYZINE PAMOATE 50 MG PO CAPS: 50.0000 mg | ORAL_CAPSULE | Freq: Three times a day (TID) | ORAL | 2 refills | Status: DC | PRN

## 2019-01-30 NOTE — Progress Notes (Signed)
Subjective:    Patient ID: Kathleen Saunders, female    DOB: [birth year], 68 y.o.   MRN: [id number]  Chief Complaint  Patient presents with  . Medical Management of Chronic Issues   PT presents to the office today for chronic follow up. She also see's Ortho for bilateral knee pain and back pain. Hypertension This is a chronic problem. The current episode started more than 1 year ago. The problem has been resolved since onset. The problem is controlled. Associated symptoms include anxiety and malaise/fatigue. Pertinent negatives include no headaches, peripheral edema or shortness of breath. Risk factors for coronary artery disease include dyslipidemia, diabetes mellitus, obesity and sedentary lifestyle. The current treatment provides moderate improvement. Hypertensive end-organ damage includes CAD/MI.  Asthma There is no cough, shortness of breath or wheezing. This is a chronic problem. The current episode started more than 1 year ago. The problem occurs intermittently. The problem has been waxing and waning. Associated symptoms include malaise/fatigue. Pertinent negatives include no ear congestion, ear pain or headaches. She reports moderate improvement on treatment. Her past medical history is significant for asthma.  Arthritis Presents for follow-up visit. She complains of pain and stiffness. Affected locations include the right knee and left knee (back). Her pain is at a severity of 9/10 (4 with pain medicaiton).  Anemia Presents for follow-up visit. Symptoms include malaise/fatigue. There has been no anorexia or bruising/bleeding easily.  Hyperlipidemia This is a chronic problem. The current episode started more than 1 year ago. Exacerbating diseases include obesity. Pertinent negatives include no shortness of breath. Current antihyperlipidemic treatment includes statins. The current treatment provides moderate improvement of lipids. Risk factors for coronary artery disease include dyslipidemia,  diabetes mellitus, hypertension, a sedentary lifestyle and post-menopausal.  Anxiety Presents for follow-up visit. Symptoms include decreased concentration, depressed mood, excessive worry, insomnia, irritability and restlessness. Patient reports no shortness of breath. Symptoms occur occasionally. The severity of symptoms is moderate. The quality of sleep is good.   Her past medical history is significant for anemia and asthma.  Depression        This is a chronic problem.  The current episode started more than 1 year ago.   The onset quality is gradual.   The problem occurs intermittently.  Associated symptoms include decreased concentration, insomnia, irritable, restlessness, decreased interest and sad.  Associated symptoms include no helplessness, no hopelessness and no headaches.  Past medical history includes anxiety.   OSA Uses CPAP every night. Stable.   Pain assessment: Cause of pain- Chronic back and bilateral knee Pain on scale of 1-10- 9 Frequency- Constantly What increases pain-Walking, standing What makes pain Better-Resting, pain medicaiton Any change in general medical condition-Fatigue  Current opioids rx- Oxycodone 7.5-325 mg # meds rx- 120 Effectiveness of current meds-Stable Adverse reactions form pain meds-Constipation, fatigue Morphine equivalent- 45 mme/day  Pill count performed-No Last drug screen - 07/31/18 ( high risk q69m moderate risk q662mlow risk yearly ) U[place] drug screen today- No [place]  If yes were their any concerning findings? - none  No flowsheet data found.   Pain contract signed on:08/15/18    Review of Systems  Constitutional: Positive for irritability and malaise/fatigue.  HENT: Negative for ear pain.   Respiratory: Negative for cough, shortness of breath and wheezing.   Gastrointestinal: Negative for anorexia.  Musculoskeletal: Positive for arthritis and stiffness.  Neurological: Negative for headaches.   Hematological: Does not bruise/bleed easily.  Psychiatric/Behavioral: Positive for decreased concentration and depression.  The patient has insomnia.   All other systems reviewed and are negative.      Objective:   Physical Exam Vitals signs reviewed.  Constitutional:      General: She is irritable. She is not in acute distress.    Appearance: She is well-developed. She is obese.  HENT:     Head: Normocephalic and atraumatic.     Right Ear: Tympanic membrane normal.     Left Ear: Tympanic membrane normal.  Eyes:     Pupils: Pupils are equal, round, and reactive to light.  Neck:     Musculoskeletal: Normal range of motion and neck supple.     Thyroid: No thyromegaly.  Cardiovascular:     Rate and Rhythm: Normal rate and regular rhythm.     Heart sounds: Normal heart sounds. No murmur.  Pulmonary:     Effort: Pulmonary effort is normal. No respiratory distress.     Breath sounds: Normal breath sounds. No wheezing.  Abdominal:     General: Bowel sounds are normal. There is no distension.     Palpations: Abdomen is soft.     Tenderness: There is no abdominal tenderness.  Musculoskeletal: Normal range of motion.        General: No tenderness.  Skin:    General: Skin is warm and dry.  Neurological:     Mental Status: She is alert and oriented to person, place, and time.     Cranial Nerves: No cranial nerve deficit.     Deep Tendon Reflexes: Reflexes are normal and symmetric.  Psychiatric:        Behavior: Behavior normal.        Thought Content: Thought content normal.        Judgment: Judgment normal.       BP 129/74   Pulse 64   Temp 98.2 F (36.8 C) (Temporal)   Ht _0  (1.6 m)   Wt 280 lb (127 kg)   BMI 49.60 kg/m      Assessment & Plan:  Kathleen Saunders comes in today with chief complaint of Medical Management of Chronic Issues   Diagnosis and orders addressed:  1. Essential hypertension - CMP14+EGFR  2. Moderate persistent asthma without complication  - OZH08+MVHQ  3. OSA on CPAP - CMP14+EGFR  4. Primary osteoarthritis involving multiple joints - CMP14+EGFR - oxyCODONE-acetaminophen (PERCOCET) 7.5-325 MG tablet; Take 1 tablet by mouth every 6 (six) hours as needed for severe pain.  Dispense: 120 tablet; Refill: 0 - oxyCODONE-acetaminophen (PERCOCET) 7.5-325 MG tablet; Take 1 tablet by mouth every 6 (six) hours as needed for severe pain.  Dispense: 120 tablet; Refill: 0 - oxyCODONE-acetaminophen (PERCOCET) 7.5-325 MG tablet; Take 1 tablet by mouth every 6 (six) hours as needed for severe pain. Do not fill until 03/02/19  Dispense: 120 tablet; Refill: 0 - methocarbamol (ROBAXIN) 500 MG tablet; TAKE 1 TABLET(500 MG) BY MOUTH EVERY 6 HOURS AS NEEDED FOR MUSCLE SPASMS  Dispense: 120 tablet; Refill: 2  5. Chronic bilateral low back pain with right-sided sciatica - CMP14+EGFR - oxyCODONE-acetaminophen (PERCOCET) 7.5-325 MG tablet; Take 1 tablet by mouth every 6 (six) hours as needed for severe pain.  Dispense: 120 tablet; Refill: 0 - oxyCODONE-acetaminophen (PERCOCET) 7.5-325 MG tablet; Take 1 tablet by mouth every 6 (six) hours as needed for severe pain.  Dispense: 120 tablet; Refill: 0 - oxyCODONE-acetaminophen (PERCOCET) 7.5-325 MG tablet; Take 1 tablet by mouth every 6 (six) hours as needed for severe pain. Do not fill until 03/02/19  Dispense: 120 tablet; Refill: 0  6. Moderate episode of recurrent major depressive disorder (HCC) - CMP14+EGFR  7. GAD (generalized anxiety disorder) - CMP14+EGFR - hydrOXYzine (VISTARIL) 50 MG capsule; Take 1 capsule (50 mg total) by mouth 3 (three) times daily as needed.  Dispense: 90 capsule; Refill: 2  8. Mixed hyperlipidemia - CMP14+EGFR  9. Morbid obesity with BMI of 50.0-59.9, adult ([place]) - CMP14+EGFR  10. Normocytic anemia - Ambulatory referral to Oncology - CMP14+EGFR - Anemia Profile B  11. Uncomplicated opioid dependence ([place]) - CMP14+EGFR - oxyCODONE-acetaminophen (PERCOCET) 7.5-325 MG  tablet; Take 1 tablet by mouth every 6 (six) hours as needed for severe pain.  Dispense: 120 tablet; Refill: 0 - oxyCODONE-acetaminophen (PERCOCET) 7.5-325 MG tablet; Take 1 tablet by mouth every 6 (six) hours as needed for severe pain.  Dispense: 120 tablet; Refill: 0 - oxyCODONE-acetaminophen (PERCOCET) 7.5-325 MG tablet; Take 1 tablet by mouth every 6 (six) hours as needed for severe pain. Do not fill until 03/02/19  Dispense: 120 tablet; Refill: 0  12. Pain medication agreement signed - CMP14+EGFR - oxyCODONE-acetaminophen (PERCOCET) 7.5-325 MG tablet; Take 1 tablet by mouth every 6 (six) hours as needed for severe pain.  Dispense: 120 tablet; Refill: 0 - oxyCODONE-acetaminophen (PERCOCET) 7.5-325 MG tablet; Take 1 tablet by mouth every 6 (six) hours as needed for severe pain.  Dispense: 120 tablet; Refill: 0 - oxyCODONE-acetaminophen (PERCOCET) 7.5-325 MG tablet; Take 1 tablet by mouth every 6 (six) hours as needed for severe pain. Do not fill until 03/02/19  Dispense: 120 tablet; Refill: 0  13. Positive colorectal cancer screening using Cologuard test - Ambulatory referral to Gastroenterology - CMP14+EGFR   Labs pending Health Maintenance reviewed Diet and exercise encouraged  Follow up plan: 3 months    Evelina Dun, FNP

## 2019-01-30 NOTE — Patient Instructions (Signed)
Anemia  Anemia is a condition in which you do not have enough red blood cells or hemoglobin. Hemoglobin is a substance in red blood cells that carries oxygen. When you do not have enough red blood cells or hemoglobin (are anemic), your body cannot get enough oxygen and your organs may not work properly. As a result, you may feel very tired or have other problems. What are the causes? Common causes of anemia include:  Excessive bleeding. Anemia can be caused by excessive bleeding inside or outside the body, including bleeding from the intestine or from periods in women.  Poor nutrition.  Long-lasting (chronic) kidney, thyroid, and liver disease.  Bone marrow disorders.  Cancer and treatments for cancer.  HIV (human immunodeficiency virus) and AIDS (acquired immunodeficiency syndrome).  Treatments for HIV and AIDS.  Spleen problems.  Blood disorders.  Infections, medicines, and autoimmune disorders that destroy red blood cells. What are the signs or symptoms? Symptoms of this condition include:  Minor weakness.  Dizziness.  Headache.  Feeling heartbeats that are irregular or faster than normal (palpitations).  Shortness of breath, especially with exercise.  Paleness.  Cold sensitivity.  Indigestion.  Nausea.  Difficulty sleeping.  Difficulty concentrating. Symptoms may occur suddenly or develop slowly. If your anemia is mild, you may not have symptoms. How is this diagnosed? This condition is diagnosed based on:  Blood tests.  Your medical history.  A physical exam.  Bone marrow biopsy. Your health care provider may also check your stool (feces) for blood and may do additional testing to look for the cause of your bleeding. You may also have other tests, including:  Imaging tests, such as a CT scan or MRI.  Endoscopy.  Colonoscopy. How is this treated? Treatment for this condition depends on the cause. If you continue to lose a lot of blood, you may  need to be treated at a hospital. Treatment may include:  Taking supplements of iron, vitamin S31, or folic acid.  Taking a hormone medicine (erythropoietin) that can help to stimulate red blood cell growth.  Having a blood transfusion. This may be needed if you lose a lot of blood.  Making changes to your diet.  Having surgery to remove your spleen. Follow these instructions at home:  Take over-the-counter and prescription medicines only as told by your health care provider.  Take supplements only as told by your health care provider.  Follow any diet instructions that you were given.  Keep all follow-up visits as told by your health care provider. This is important. Contact a health care provider if:  You develop new bleeding anywhere in the body. Get help right away if:  You are very weak.  You are short of breath.  You have pain in your abdomen or chest.  You are dizzy or feel faint.  You have trouble concentrating.  You have bloody or black, tarry stools.  You vomit repeatedly or you vomit up blood. Summary  Anemia is a condition in which you do not have enough red blood cells or enough of a substance in your red blood cells that carries oxygen (hemoglobin).  Symptoms may occur suddenly or develop slowly.  If your anemia is mild, you may not have symptoms.  This condition is diagnosed with blood tests as well as a medical history and physical exam. Other tests may be needed.  Treatment for this condition depends on the cause of the anemia. This information is not intended to replace advice given to you by  your health care provider. Make sure you discuss any questions you have with your health care provider. Document Released: 07/01/2004 Document Revised: 05/06/2017 Document Reviewed: 06/25/2016 Elsevier Patient Education  2020 [employer].

## 2019-01-31 LAB — ANEMIA PROFILE B
Basophils Absolute: 0.1 10*3/uL (ref 0.0–0.2)
Basos: 1 %
EOS (ABSOLUTE): 0.4 10*3/uL (ref 0.0–0.4)
Eos: 4 %
Ferritin: 19 ng/mL (ref 15–150)
Folate: 5.9 ng/mL (ref >3.0)
Hematocrit: 33.6 % — ABNORMAL LOW (ref 34.0–46.6)
Hemoglobin: 10.4 g/dL — ABNORMAL LOW (ref 11.1–15.9)
Immature Grans (Abs): 0 10*3/uL (ref 0.0–0.1)
Immature Granulocytes: 0 %
Iron Saturation: 16 % (ref 15–55)
Iron: 50 ug/dL (ref 27–139)
Lymphocytes Absolute: 2.4 10*3/uL (ref 0.7–3.1)
Lymphs: 24 %
MCH: 26.3 pg — ABNORMAL LOW (ref 26.6–33.0)
MCHC: 31 g/dL — ABNORMAL LOW (ref 31.5–35.7)
MCV: 85 fL (ref 79–97)
Monocytes Absolute: 0.7 10*3/uL (ref 0.1–0.9)
Monocytes: 7 %
Neutrophils Absolute: 6.4 10*3/uL (ref 1.4–7.0)
Neutrophils: 64 %
Platelets: 364 10*3/uL (ref 150–450)
RBC: 3.95 x10E6/uL (ref 3.77–5.28)
RDW: 13.7 % (ref 11.7–15.4)
Retic Ct Pct: 1.7 % (ref 0.6–2.6)
Total Iron Binding Capacity: 322 ug/dL (ref 250–450)
UIBC: 272 ug/dL (ref 118–369)
Vitamin B-12: 762 pg/mL (ref 232–1245)
WBC: 10 10*3/uL (ref 3.4–10.8)

## 2019-01-31 LAB — CMP14+EGFR
ALT: 10 IU/L (ref 0–32)
AST: 10 IU/L (ref 0–40)
Albumin/Globulin Ratio: 2 (ref 1.2–2.2)
Albumin: 4.1 g/dL (ref 3.8–4.8)
Alkaline Phosphatase: 82 IU/L (ref 39–117)
BUN/Creatinine Ratio: 28 (ref 12–28)
BUN: 39 mg/dL — ABNORMAL HIGH (ref 8–27)
Bilirubin Total: <0.2 mg/dL (ref 0.0–1.2)
CO2: 23 mmol/L (ref 20–29)
Calcium: 9.4 mg/dL (ref 8.7–10.3)
Chloride: 102 mmol/L (ref 96–106)
Creatinine, Ser: 1.41 mg/dL — ABNORMAL HIGH (ref 0.57–1.00)
GFR calc Af Amer: 45 mL/min/{1.73_m2} — ABNORMAL LOW (ref >59)
GFR calc non Af Amer: 39 mL/min/{1.73_m2} — ABNORMAL LOW (ref >59)
Globulin, Total: 2.1 g/dL (ref 1.5–4.5)
Glucose: 104 mg/dL — ABNORMAL HIGH (ref 65–99)
Potassium: 5.6 mmol/L — ABNORMAL HIGH (ref 3.5–5.2)
Sodium: 138 mmol/L (ref 134–144)
Total Protein: 6.2 g/dL (ref 6.0–8.5)

## 2019-02-01 ENCOUNTER — Other Ambulatory Visit: Payer: Self-pay | Admitting: Family

## 2019-02-05 DIAGNOSIS — M1712 Unilateral primary osteoarthritis, left knee: Secondary | ICD-10-CM | POA: Diagnosis not present

## 2019-02-06 ENCOUNTER — Encounter (HOSPITAL_COMMUNITY): Payer: Self-pay

## 2019-02-07 ENCOUNTER — Other Ambulatory Visit: Payer: Self-pay

## 2019-02-07 ENCOUNTER — Inpatient Hospital Stay (HOSPITAL_COMMUNITY): Payer: PPO

## 2019-02-07 ENCOUNTER — Inpatient Hospital Stay (HOSPITAL_COMMUNITY): Payer: PPO | Attending: Hematology | Admitting: Hematology

## 2019-02-07 ENCOUNTER — Other Ambulatory Visit: Payer: Self-pay | Admitting: Family

## 2019-02-07 ENCOUNTER — Encounter (HOSPITAL_COMMUNITY): Payer: Self-pay | Admitting: Hematology

## 2019-02-07 VITALS — BP 148/68 | HR 65 | Temp 97.3°F | Resp 18 | Wt 277.0 lb

## 2019-02-07 DIAGNOSIS — E611 Iron deficiency: Secondary | ICD-10-CM | POA: Diagnosis not present

## 2019-02-07 DIAGNOSIS — Z809 Family history of malignant neoplasm, unspecified: Secondary | ICD-10-CM

## 2019-02-07 DIAGNOSIS — K625 Hemorrhage of anus and rectum: Secondary | ICD-10-CM

## 2019-02-07 DIAGNOSIS — N189 Chronic kidney disease, unspecified: Secondary | ICD-10-CM | POA: Diagnosis not present

## 2019-02-07 DIAGNOSIS — Z806 Family history of leukemia: Secondary | ICD-10-CM

## 2019-02-07 DIAGNOSIS — D631 Anemia in chronic kidney disease: Secondary | ICD-10-CM | POA: Insufficient documentation

## 2019-02-07 DIAGNOSIS — K5900 Constipation, unspecified: Secondary | ICD-10-CM | POA: Diagnosis not present

## 2019-02-07 DIAGNOSIS — F411 Generalized anxiety disorder: Secondary | ICD-10-CM

## 2019-02-07 DIAGNOSIS — D649 Anemia, unspecified: Secondary | ICD-10-CM | POA: Diagnosis not present

## 2019-02-07 DIAGNOSIS — Z800 Family history of malignant neoplasm of digestive organs: Secondary | ICD-10-CM | POA: Diagnosis not present

## 2019-02-07 DIAGNOSIS — Z87891 Personal history of nicotine dependence: Secondary | ICD-10-CM

## 2019-02-07 DIAGNOSIS — F331 Major depressive disorder, recurrent, moderate: Secondary | ICD-10-CM

## 2019-02-07 LAB — RETICULOCYTES
Immature Retic Fract: 11.8 % (ref 2.3–15.9)
RBC.: 4.08 MIL/uL (ref 3.87–5.11)
Retic Count, Absolute: 57.9 10*3/uL (ref 19.0–186.0)
Retic Ct Pct: 1.4 % (ref 0.4–3.1)

## 2019-02-07 LAB — LACTATE DEHYDROGENASE: LDH: 150 U/L (ref 98–192)

## 2019-02-07 NOTE — Patient Instructions (Signed)
[facility] at [facility] Discharge Instructions  Follow up in 3 months with lab s   Thank you for choosing [facility] at [facility] to provide your oncology and hematology care.  To afford each patient quality time with our provider, please arrive at least 15 minutes before your scheduled appointment time.   If you have a lab appointment with the Cancer Center please come in thru the Main Entrance and check in at the main information desk.  You need to re-schedule your appointment should you arrive 10 or more minutes late.  We strive to give you quality time with our providers, and arriving late affects you and other patients whose appointments are after yours.  Also, if you no show three or more times for appointments you may be dismissed from the clinic at the providers discretion.     Again, thank you for choosing [facility].  Our hope is that these requests will decrease the amount of time that you wait before being seen by our physicians.       _____________________________________________________________  Should you have questions after your visit to [facility], please contact our office at [phone] between the hours of 8:00 a.m. and 4:30 p.m.  Voicemails left after 4:00 p.m. will not be returned until the following business day.  For prescription refill requests, have your pharmacy contact our office and allow 72 hours.    Due to Covid, you will need to wear a mask upon entering the hospital. If you do not have a mask, a mask will be given to you at the Main Entrance upon arrival. For doctor visits, patients may have 1 support person with them. For treatment visits, patients can not have anyone with them due to social distancing guidelines and our immunocompromised population.      

## 2019-02-07 NOTE — Assessment & Plan Note (Addendum)
1.  Normocytic anemia: - CBC on 01/30/2019 shows hemoglobin 10.4 with MCV of 85.  Platelets and white count were normal. - Ferritin was 19, percent saturation 16.  F81 and folic acid were normal. - Etiology includes CKD and relative iron deficiency. -Patient reports that she has taken iron tablet for 2 years and quit recently secondary to constipation.  Denies any pica.  She complains of feeling severe fatigue. - We will check LDH and reticulocyte count.  We will also check SPEP and copper levels. - Because of her low iron levels, I have recommended 2 weekly infusions of Feraheme.  We talked about side effects in detail. -We will aim for a ferritin above 100 and percent saturation above 25. -We will see her back in 6 weeks for follow-up with repeat CBC, ferritin and iron panel.  2.  Bleeding per rectum: -Patient never had a colonoscopy. -She reported bleeding per rectum when she is constipated.  Pain pills for her knee joints and back make her constipated. - I have recommended stool testing.  I have strongly encouraged her to have colonoscopy.

## 2019-02-07 NOTE — Progress Notes (Signed)
CONSULT NOTE  Patient Care Team: Sharion Balloon, FNP as PCP - General (Family Medicine) Satira Sark, MD as PCP - Cardiology (Cardiology)  CHIEF COMPLAINTS/PURPOSE OF CONSULTATION: Normocytic anemia   HISTORY OF PRESENTING ILLNESS:  Kathleen Saunders 68 y.o. female seen in consultation today for normocytic anemia at the request of Evelina Dun, [place].   She has been feeling very fatigued lately.  She denies any easy bruising or bleeding. She denies any unusual cravings. She denies any B symptoms. Denies any nausea, vomiting, or diarrhea. Denies any new pains. Had not noticed any recent bleeding such as epistaxis, hematuria or hematochezia. Denies recent chest pain on exertion, shortness of breath on minimal exertion, pre-syncopal episodes, or palpitations. Denies any numbness or tingling in hands or feet. Denies any recent fevers, infections, or recent hospitalizations. Patient reports appetite at 100% and energy level at 0%. She has tried oral iron supplementation and was unable to tolerate due to constipation which caused bleeding.  Denies any prior history of blood transfusion. She reports she has never had a colonoscopy however she did do the Cologard testing which reported blood in her stool. She will set up her colonoscopy soon. She denies any blood in her urine or nosebleeds. She works has a Theme park manager part time. She lives at home with her husband and does all of her household activity. She reports she smoked 1 pack per day for 20 years.  She quit smoking 20 years ago. She has a family history of cancer. Her paternal grandfather had leukemia. Maternal uncle had colon cancer.  Another paternal uncle had cancer.   MEDICAL HISTORY:  Past Medical History:  Diagnosis Date  . Anxiety   . Arthritis   . Depression   . Essential hypertension   . Hyperlipidemia   . OSA (obstructive sleep apnea)     SURGICAL HISTORY: Past Surgical History:  Procedure Laterality Date  . ABDOMINAL  HYSTERECTOMY    . CARPAL TUNNEL RELEASE    . CHOLECYSTECTOMY    . GASTRIC BYPASS    . PLANTAR FASCIA RELEASE Right   . REPLACEMENT TOTAL KNEE Right     SOCIAL HISTORY: Social History   Socioeconomic History  . Marital status: Married    Spouse name: Broadus John  . Number of children: 3  . Years of education: Not on file  . Highest education level: High school graduate  Occupational History  . Occupation: Stylist    Comment: self employeed  Social Needs  . Financial resource strain: Not very hard  . Food insecurity    Worry: Never true    Inability: Never true  . Transportation needs    Medical: No    Non-medical: No  Tobacco Use  . Smoking status: Former Smoker    Types: Cigarettes    Quit date: 06/29/1987    Years since quitting: 31.6  . Smokeless tobacco: Never Used  Substance and Sexual Activity  . Alcohol use: No  . Drug use: No  . Sexual activity: Yes  Lifestyle  . Physical activity    Days per week: 0 days    Minutes per session: 0 min  . Stress: Only a little  Relationships  . Social Herbalist on phone: Once a week    Gets together: Once a week    Attends religious service: More than 4 times per year    Active member of club or organization: Yes    Attends meetings of clubs or organizations: More  than 4 times per year    Relationship status: Married  . Intimate partner violence    Fear of current or ex partner: No    Emotionally abused: No    Physically abused: No    Forced sexual activity: No  Other Topics Concern  . Not on file  Social History Narrative   Patient is right-handed, married lives with husband in a one story house. She ddrinks 1-2 glasses of tea a day, an occasional soda and rarely coffee. She is limited in her activity due to arthritis. She has a Field seismologist.    FAMILY HISTORY: Family History  Problem Relation Age of Onset  . Arthritis Mother   . Heart disease Father   . Heart attack Father        Died at 14 MI   . Stroke Sister   . Heart attack Brother   . Heart attack Maternal Uncle   . Cancer Maternal Uncle   . Heart attack Maternal Grandfather   . Diabetes Paternal Grandmother   . Cancer Paternal Grandfather     ALLERGIES:  is allergic to simvastatin.  MEDICATIONS:  Current Outpatient Medications  Medication Sig Dispense Refill  . albuterol (PROVENTIL) (2.5 MG/3ML) 0.083% nebulizer solution Take 3 mLs (2.5 mg total) by nebulization every 6 (six) hours as needed for wheezing or shortness of breath. 150 mL 1  . budesonide-formoterol (SYMBICORT) 80-4.5 MCG/ACT inhaler Inhale 2 puffs into the lungs 2 (two) times daily. 1 Inhaler 3  . colchicine 0.6 MG tablet 1.2 mg then one hour later take 0.6 mg if still having pain. Max 1.8 mg/day 20 tablet 1  . furosemide (LASIX) 20 MG tablet TAKE 1 TABLET(20 MG) BY MOUTH DAILY 90 tablet 0  . lisinopril-hydrochlorothiazide (ZESTORETIC) 20-25 MG tablet TAKE 1 TABLET BY MOUTH DAILY 90 tablet 1  . methocarbamol (ROBAXIN) 500 MG tablet TAKE 1 TABLET(500 MG) BY MOUTH EVERY 6 HOURS AS NEEDED FOR MUSCLE SPASMS 120 tablet 2  . naloxegol oxalate (MOVANTIK) 25 MG TABS tablet Take 1 tablet (25 mg total) by mouth daily. 30 tablet 3  . oxyCODONE-acetaminophen (PERCOCET) 7.5-325 MG tablet Take 1 tablet by mouth every 6 (six) hours as needed for severe pain. 120 tablet 0  . oxyCODONE-acetaminophen (PERCOCET) 7.5-325 MG tablet Take 1 tablet by mouth every 6 (six) hours as needed for severe pain. 120 tablet 0  . oxyCODONE-acetaminophen (PERCOCET) 7.5-325 MG tablet Take 1 tablet by mouth every 6 (six) hours as needed for severe pain. Do not fill until 03/02/19 120 tablet 0  . pantoprazole (PROTONIX) 40 MG tablet TAKE 1 TABLET(40 MG) BY MOUTH DAILY 90 tablet 0  . sertraline (ZOLOFT) 50 MG tablet TAKE 1 TABLET(50 MG) BY MOUTH DAILY 90 tablet 0   No current facility-administered medications for this visit.     REVIEW OF SYSTEMS:   Constitutional: Denies fevers, chills or  abnormal night sweats Eyes: Denies blurriness of vision, double vision or watery eyes Ears, nose, mouth, throat, and face: Denies mucositis or sore throat Respiratory: Denies cough, dyspnea or wheezes Cardiovascular: Denies palpitation, chest discomfort or lower extremity swelling Gastrointestinal:  Denies nausea, heartburn or change in bowel habits Skin: Denies abnormal skin rashes Lymphatics: Denies new lymphadenopathy or easy bruising Neurological:Denies numbness, tingling or new weaknesses Behavioral/Psych: Mood is stable, no new changes  All other systems were reviewed with the patient and are negative.  PHYSICAL EXAMINATION: ECOG PERFORMANCE STATUS: 1 - Symptomatic but completely ambulatory  Vitals:   02/07/19 1407  BP: Marland Kitchen)  148/68  Pulse: 65  Resp: 18  Temp: (!) 97.3 F (36.3 C)  SpO2: 96%   Filed Weights   02/07/19 1407  Weight: 277 lb (125.6 kg)    GENERAL:alert, no distress and comfortable SKIN: skin color, texture, turgor are normal, no rashes or significant lesions EYES: normal, conjunctiva are pink and non-injected, sclera clear OROPHARYNX:no exudate, no erythema and lips, buccal mucosa, and tongue normal  NECK: supple, thyroid normal size, non-tender, without nodularity LYMPH:  no palpable lymphadenopathy in the cervical, axillary or inguinal LUNGS: clear to auscultation and percussion with normal breathing effort HEART: regular rate & rhythm and no murmurs and no lower extremity edema ABDOMEN:abdomen soft, non-tender and normal bowel sounds Musculoskeletal:no cyanosis of digits and no clubbing  PSYCH: alert & oriented x 3 with fluent speech NEURO: no focal motor/sensory deficits  LABORATORY DATA:  I have reviewed the data as listed Recent Results (from the past 2160 hour(s))  BMP8+EGFR     Status: Abnormal   Collection Time: 11/13/18 10:46 AM  Result Value Ref Range   Glucose 129 (H) 65 - 99 mg/dL   BUN 50 (H) 8 - 27 mg/dL   Creatinine, Ser 1.87 (H)  0.57 - 1.00 mg/dL   GFR calc non Af Amer 28 (L) >59 mL/min/1.73   GFR calc Af Amer 32 (L) >59 mL/min/1.73   BUN/Creatinine Ratio 27 12 - 28   Sodium 139 134 - 144 mmol/L   Potassium 4.9 3.5 - 5.2 mmol/L   Chloride 103 96 - 106 mmol/L   CO2 18 (L) 20 - 29 mmol/L   Calcium 9.8 8.7 - 10.3 mg/dL  CMP14+EGFR     Status: Abnormal   Collection Time: 01/30/19  3:07 PM  Result Value Ref Range   Glucose 104 (H) 65 - 99 mg/dL   BUN 39 (H) 8 - 27 mg/dL   Creatinine, Ser 1.41 (H) 0.57 - 1.00 mg/dL   GFR calc non Af Amer 39 (L) >59 mL/min/1.73   GFR calc Af Amer 45 (L) >59 mL/min/1.73   BUN/Creatinine Ratio 28 12 - 28   Sodium 138 134 - 144 mmol/L   Potassium 5.6 (H) 3.5 - 5.2 mmol/L   Chloride 102 96 - 106 mmol/L   CO2 23 20 - 29 mmol/L   Calcium 9.4 8.7 - 10.3 mg/dL   Total Protein 6.2 6.0 - 8.5 g/dL   Albumin 4.1 3.8 - 4.8 g/dL   Globulin, Total 2.1 1.5 - 4.5 g/dL   Albumin/Globulin Ratio 2.0 1.2 - 2.2   Bilirubin Total <0.2 0.0 - 1.2 mg/dL   Alkaline Phosphatase 82 39 - 117 IU/L   AST 10 0 - 40 IU/L   ALT 10 0 - 32 IU/L  Anemia Profile B     Status: Abnormal   Collection Time: 01/30/19  3:07 PM  Result Value Ref Range   Total Iron Binding Capacity 322 250 - 450 ug/dL   UIBC 272 118 - 369 ug/dL   Iron 50 27 - 139 ug/dL   Iron Saturation 16 15 - 55 %   Ferritin 19 15 - 150 ng/mL   Vitamin B-12 762 232 - 1,245 pg/mL   Folate 5.9 >3.0 ng/mL    Comment: A serum folate concentration of less than 3.1 ng/mL is considered to represent clinical deficiency.    WBC 10.0 3.4 - 10.8 x10E3/uL   RBC 3.95 3.77 - 5.28 x10E6/uL   Hemoglobin 10.4 (L) 11.1 - 15.9 g/dL   Hematocrit 33.6 (L) 34.0 -  46.6 %   MCV 85 79 - 97 fL   MCH 26.3 (L) 26.6 - 33.0 pg   MCHC 31.0 (L) 31.5 - 35.7 g/dL   RDW 13.7 11.7 - 15.4 %   Platelets 364 150 - 450 x10E3/uL   Neutrophils 64 Not Estab. %   Lymphs 24 Not Estab. %   Monocytes 7 Not Estab. %   Eos 4 Not Estab. %   Basos 1 Not Estab. %   Neutrophils Absolute  6.4 1.4 - 7.0 x10E3/uL   Lymphocytes Absolute 2.4 0.7 - 3.1 x10E3/uL   Monocytes Absolute 0.7 0.1 - 0.9 x10E3/uL   EOS (ABSOLUTE) 0.4 0.0 - 0.4 x10E3/uL   Basophils Absolute 0.1 0.0 - 0.2 x10E3/uL   Immature Granulocytes 0 Not Estab. %   Immature Grans (Abs) 0.0 0.0 - 0.1 x10E3/uL   Retic Ct Pct 1.7 0.6 - 2.6 %  Reticulocytes     Status: None   Collection Time: 02/07/19  3:18 PM  Result Value Ref Range   Retic Ct Pct 1.4 0.4 - 3.1 %   RBC. 4.08 3.87 - 5.11 MIL/uL   Retic Count, Absolute 57.9 19.0 - 186.0 K/uL   Immature Retic Fract 11.8 2.3 - 15.9 %    Comment: Performed at [facility], [address], [place], [place] [address]  Lactate dehydrogenase     Status: None   Collection Time: 02/07/19  3:18 PM  Result Value Ref Range   LDH 150 98 - 192 U/L    Comment: Performed at [facility], [address] [place], [place] [address]    RADIOGRAPHIC STUDIES: I have personally reviewed the radiological images as listed and agreed with the findings in the report.  ASSESSMENT & PLAN:  Normocytic anemia 1.  Normocytic anemia: - CBC on 01/30/2019 shows hemoglobin 10.4 with MCV of 85.  Platelets and white count were normal. - Ferritin was 19, percent saturation 16.  U98 and folic acid were normal. - Etiology includes CKD and relative iron deficiency. -Patient reports that she has taken iron tablet for 2 years and quit recently secondary to constipation.  Denies any pica.  She complains of feeling severe fatigue. - We will check LDH and reticulocyte count.  We will also check SPEP and copper levels. - Because of her low iron levels, I have recommended 2 weekly infusions of Feraheme.  We talked about side effects in detail. -We will aim for a ferritin above 100 and percent saturation above 25. -We will see her back in 6 weeks for follow-up with repeat CBC, ferritin and iron panel.  2.  Bleeding per rectum: -Patient never had a colonoscopy. -She reported bleeding per rectum when she is  constipated.  Pain pills for her knee joints and back make her constipated. - I have recommended stool testing.  I have strongly encouraged her to have colonoscopy.     All questions were answered. The patient knows to call the clinic with any problems, questions or concerns.      Derek Jack, MD 02/07/19 6:22 PM

## 2019-02-08 LAB — PROTEIN ELECTROPHORESIS, SERUM
A/G Ratio: 1.2 (ref 0.7–1.7)
Albumin ELP: 3.4 g/dL (ref 2.9–4.4)
Alpha-1-Globulin: 0.2 g/dL (ref 0.0–0.4)
Alpha-2-Globulin: 0.9 g/dL (ref 0.4–1.0)
Beta Globulin: 1.1 g/dL (ref 0.7–1.3)
Gamma Globulin: 0.6 g/dL (ref 0.4–1.8)
Globulin, Total: 2.8 g/dL (ref 2.2–3.9)
Total Protein ELP: 6.2 g/dL (ref 6.0–8.5)

## 2019-02-09 ENCOUNTER — Encounter: Payer: Self-pay | Admitting: *Deleted

## 2019-02-10 LAB — COPPER, SERUM: Copper: 137 ug/dL (ref 72–166)

## 2019-02-10 NOTE — Addendum Note (Signed)
Addended by: Derek Jack on: 02/10/2019 04:14 PM   Modules accepted: Level of Service

## 2019-02-13 ENCOUNTER — Other Ambulatory Visit: Payer: Self-pay

## 2019-02-13 ENCOUNTER — Encounter (HOSPITAL_COMMUNITY): Payer: Self-pay

## 2019-02-13 ENCOUNTER — Inpatient Hospital Stay (HOSPITAL_COMMUNITY): Payer: PPO

## 2019-02-13 VITALS — BP 127/57 | HR 59 | Temp 97.4°F | Resp 16

## 2019-02-13 DIAGNOSIS — N189 Chronic kidney disease, unspecified: Secondary | ICD-10-CM | POA: Diagnosis not present

## 2019-02-13 DIAGNOSIS — D649 Anemia, unspecified: Secondary | ICD-10-CM

## 2019-02-13 MED ORDER — SODIUM CHLORIDE 0.9 % IV SOLN
510.0000 mg | Freq: Once | INTRAVENOUS | Status: AC
  Administered 2019-02-13: 510 mg via INTRAVENOUS
  Filled 2019-02-13: qty 510

## 2019-02-13 MED ORDER — SODIUM CHLORIDE 0.9 % IV SOLN
Freq: Once | INTRAVENOUS | Status: AC
  Administered 2019-02-13: 14:00:00 via INTRAVENOUS

## 2019-02-13 NOTE — Progress Notes (Signed)
Feraheme given per orders. Patient tolerated it well without problems. Vitals stable and discharged home from clinic via wheelchair. Follow up as scheduled.  

## 2019-02-21 ENCOUNTER — Inpatient Hospital Stay (HOSPITAL_COMMUNITY): Payer: PPO

## 2019-02-21 NOTE — Progress Notes (Signed)
Subjective:    Patient ID: Kathleen Saunders, female    DOB: 07/09/1950, 68 y.o.   MRN: [id number]  HPI Kathleen Saunders is a 68 year old female with a past medical history of asthma, anxiety, depression, hypertension and OSA intermittent use of CPAP.  Past cholecystectomy age 69 and gastric bypass surgery 2005.  She presents today for further evaluation regarding iron deficiency anemia, heme positive stool and esophageal discomfort.  She denies ever having a screening colonoscopy.  She complains of having nighttime reflux symptoms for the past year.  She describes having a burning discomfort to her mid esophageal area which frequently occurs at night for the past year. She awakens and takes 4 Tums.  She sometimes takes an additional 2 TUMs a few hours later.  She was previously prescribed pantoprazole but this medication was not affordable.  She denies having any dysphasia or stomach pain.  She is passing a normal formed bowel movement once daily without any recent rectal bleeding.  No melena.  Approximately 6 months ago, she was constipated with straining and saw bright red blood on the toilet tissue.  No further rectal bleeding since that time.  Positive Her maternal uncle had colorectal cancer.  She complains of having fatigue. No weight loss. No fever, sweats or chills. She was diagnosed with iron deficiency anemia March 2020.  Her hemoglobin at that time was 9.8.  Hematocrit 29.6.  MCV 83.  Repeat laboratory studies 01/30/2019 showed a WBC 10.0.  Hemoglobin 10.4.  Hematocrit 33.6.  MCV 85.  Platelet 364.  Iron level 50.  Vitamin B12 762. She received iron infusion approximately 1 week ago, she is scheduled for a second iron infusion next week. She denies taking any NSAIDs.  No alcohol use.  She quit smoking 20 years ago. Her father died at the age of 79 from a heart attack.  Mother died at the age of 99 with history of rheumatoid arthritis, MRSA following her back surgery.  Past Medical History:  Diagnosis  Date   Anxiety    Arthritis    Depression    Essential hypertension    Hyperlipidemia    OSA (obstructive sleep apnea)    Past Surgical History:  Procedure Laterality Date   ABDOMINAL HYSTERECTOMY     CARPAL TUNNEL RELEASE     CHOLECYSTECTOMY     GASTRIC BYPASS     PLANTAR FASCIA RELEASE Right    REPLACEMENT TOTAL KNEE Right    Current Outpatient Medications on File Prior to Visit  Medication Sig Dispense Refill   albuterol (PROVENTIL) (2.5 MG/3ML) 0.083% nebulizer solution Take 3 mLs (2.5 mg total) by nebulization every 6 (six) hours as needed for wheezing or shortness of breath. 150 mL 1   budesonide-formoterol (SYMBICORT) 80-4.5 MCG/ACT inhaler Inhale 2 puffs into the lungs 2 (two) times daily. 1 Inhaler 3   colchicine 0.6 MG tablet 1.2 mg then one hour later take 0.6 mg if still having pain. Max 1.8 mg/day 20 tablet 1   furosemide (LASIX) 20 MG tablet TAKE 1 TABLET(20 MG) BY MOUTH DAILY 90 tablet 0   lisinopril-hydrochlorothiazide (ZESTORETIC) 20-25 MG tablet TAKE 1 TABLET BY MOUTH DAILY 90 tablet 1   methocarbamol (ROBAXIN) 500 MG tablet TAKE 1 TABLET(500 MG) BY MOUTH EVERY 6 HOURS AS NEEDED FOR MUSCLE SPASMS 120 tablet 2   naloxegol oxalate (MOVANTIK) 25 MG TABS tablet Take 1 tablet (25 mg total) by mouth daily. 30 tablet 3   oxyCODONE-acetaminophen (PERCOCET) 7.5-325 MG tablet  Take 1 tablet by mouth every 6 (six) hours as needed for severe pain. 120 tablet 0   oxyCODONE-acetaminophen (PERCOCET) 7.5-325 MG tablet Take 1 tablet by mouth every 6 (six) hours as needed for severe pain. Do not fill until 03/02/19 120 tablet 0   pantoprazole (PROTONIX) 40 MG tablet TAKE 1 TABLET(40 MG) BY MOUTH DAILY 90 tablet 0   sertraline (ZOLOFT) 50 MG tablet TAKE 1 TABLET(50 MG) BY MOUTH DAILY 90 tablet 0   No current facility-administered medications on file prior to visit.    Allergies  Allergen Reactions   Simvastatin Other (See Comments)    Body aches   Family  History  Problem Relation Age of Onset   Arthritis Mother    Heart disease Father    Heart attack Father        Died at 28 MI   Stroke Sister    Heart attack Brother    Heart attack Maternal Uncle    Cancer Maternal Uncle    Heart attack Maternal Grandfather    Diabetes Paternal Grandmother    Cancer Paternal Grandfather    Social History   Socioeconomic History   Marital status: Married    Spouse name: Broadus John   Number of children: 3   Years of education: Not on file   Highest education level: High school graduate  Occupational History   Occupation: Careers adviser    Comment: self employeed  Social Designer, fashion/clothing strain: Not very hard   Food insecurity    Worry: Never true    Inability: Never true   Transportation needs    Medical: No    Non-medical: No  Tobacco Use   Smoking status: Former Smoker    Types: Cigarettes    Quit date: 06/29/1987    Years since quitting: 31.6   Smokeless tobacco: Never Used  Substance and Sexual Activity   Alcohol use: No   Drug use: No   Sexual activity: Yes  Lifestyle   Physical activity    Days per week: 0 days    Minutes per session: 0 min   Stress: Only a little  Relationships   Social connections    Talks on phone: Once a week    Gets together: Once a week    Attends religious service: More than 4 times per year    Active member of club or organization: Yes    Attends meetings of clubs or organizations: More than 4 times per year    Relationship status: Married   Intimate partner violence    Fear of current or ex partner: No    Emotionally abused: No    Physically abused: No    Forced sexual activity: No  Other Topics Concern   Not on file  Social History Narrative   Patient is right-handed, married lives with husband in a one story house. She ddrinks 1-2 glasses of tea a day, an occasional soda and rarely coffee. She is limited in her activity due to arthritis. She has a Insurance account manager.   Review of Systems see HPI, all other systems reviewed and are negative     Objective:   Physical Exam  BP 123/63    Pulse 65    Temp 98.2 F (36.8 C) (Oral)    Ht 5' 3.5" (1.613 m)    Wt 273 lb 8 oz (124.1 kg)    BMI 47.69 kg/m   General: 68 year old obese female in no acute distress Eyes: Sclera nonicteric, conjunctiva  pink Mouth: Dentition intact, no ulcers or lesions Neck: Supple, no lymphadenopathy or thyromegaly Heart: Regular rate and rhythm, no murmurs Lungs: Breath sounds clear throughout Abdomen: Soft, nontender, no masses or organomegaly, large right upper quadrant scar, midline abdominal scar intact, positive bowel sounds to all 4 quadrants Extremities: No edema Neuro: Alert and oriented x4, no focal deficits Skin: No rash or lesions     Assessment & Plan:   471.  68 year old female with iron deficiency anemia presents with nighttime reflux symptoms and heme positive stool.  Infrequent rectal bleeding.  She  received 1 iron infusion a week ago, repeat iron infusion next week. + family hx of colon cancer. -Schedule EGD and colonoscopy with monitored anesthesia care, twelve-lead EKG, benefits and risk discussed, including risk with sedation, risk of bleeding, perforation and infection. EGD to include duodenal bx to r/o celiac dz -Patient to receive additional bowel prep to include 2 Dulcolax tablets 2 nights before her procedures then standard bowel prep -Advised patient to follow-up in the office 2 weeks after EGD and colonoscopy completed -Omeprazole 40 mg 1 capsule to be taken 30 minutes before dinner  2.  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  Asthma is currently well controlled  4.  Obstructive sleep apnea, infrequently uses CPAP  5.  Hypertension  7. CKD. Cr. 1.41 <-1.87

## 2019-02-22 ENCOUNTER — Other Ambulatory Visit: Payer: Self-pay

## 2019-02-22 ENCOUNTER — Ambulatory Visit (INDEPENDENT_AMBULATORY_CARE_PROVIDER_SITE_OTHER): Payer: PPO | Admitting: Nurse Practitioner

## 2019-02-22 ENCOUNTER — Encounter (INDEPENDENT_AMBULATORY_CARE_PROVIDER_SITE_OTHER): Payer: Self-pay | Admitting: *Deleted

## 2019-02-22 ENCOUNTER — Telehealth (INDEPENDENT_AMBULATORY_CARE_PROVIDER_SITE_OTHER): Payer: Self-pay | Admitting: *Deleted

## 2019-02-22 ENCOUNTER — Encounter (INDEPENDENT_AMBULATORY_CARE_PROVIDER_SITE_OTHER): Payer: Self-pay | Admitting: Nurse Practitioner

## 2019-02-22 VITALS — BP 123/63 | HR 65 | Temp 98.2°F | Ht 63.5 in | Wt 273.5 lb

## 2019-02-22 DIAGNOSIS — K219 Gastro-esophageal reflux disease without esophagitis: Secondary | ICD-10-CM | POA: Insufficient documentation

## 2019-02-22 DIAGNOSIS — R195 Other fecal abnormalities: Secondary | ICD-10-CM | POA: Diagnosis not present

## 2019-02-22 DIAGNOSIS — Z1211 Encounter for screening for malignant neoplasm of colon: Secondary | ICD-10-CM | POA: Diagnosis not present

## 2019-02-22 MED ORDER — OMEPRAZOLE 40 MG PO CPDR: 40.0000 mg | DELAYED_RELEASE_CAPSULE | Freq: Every day | ORAL | 3 refills | Status: AC

## 2019-02-22 MED ORDER — PEG 3350-KCL-NA BICARB-NACL 420 G PO SOLR: 4000.0000 mL | Freq: Once | ORAL | 0 refills | Status: AC

## 2019-02-22 NOTE — Telephone Encounter (Signed)
Patient needs trilyte TCS/EGD sch'd 10/23

## 2019-02-22 NOTE — Patient Instructions (Signed)
1. Schedule EGD and colonoscopy   2. Omeprazole 40mg  once daily to be taken 30 minutes before dinner.  3. Reduce Tums to 2 tabs daily as needed  4. Further follow up to be determined after EGD and colonoscopy completed

## 2019-02-28 ENCOUNTER — Other Ambulatory Visit: Payer: Self-pay

## 2019-02-28 ENCOUNTER — Inpatient Hospital Stay (HOSPITAL_COMMUNITY): Payer: PPO

## 2019-02-28 VITALS — BP 95/38 | HR 60 | Temp 96.9°F | Resp 18

## 2019-02-28 DIAGNOSIS — D649 Anemia, unspecified: Secondary | ICD-10-CM

## 2019-02-28 DIAGNOSIS — N189 Chronic kidney disease, unspecified: Secondary | ICD-10-CM | POA: Diagnosis not present

## 2019-02-28 MED ORDER — SODIUM CHLORIDE 0.9 % IV SOLN
Freq: Once | INTRAVENOUS | Status: AC
  Administered 2019-02-28: 14:00:00 via INTRAVENOUS

## 2019-02-28 MED ORDER — SODIUM CHLORIDE 0.9 % IV SOLN
510.0000 mg | Freq: Once | INTRAVENOUS | Status: AC
  Administered 2019-02-28: 14:00:00 510 mg via INTRAVENOUS
  Filled 2019-02-28: qty 510

## 2019-03-01 ENCOUNTER — Other Ambulatory Visit: Payer: Self-pay | Admitting: Family Medicine

## 2019-03-01 ENCOUNTER — Telehealth: Payer: Self-pay | Admitting: *Deleted

## 2019-03-01 DIAGNOSIS — J029 Acute pharyngitis, unspecified: Secondary | ICD-10-CM

## 2019-03-01 MED ORDER — AMOXICILLIN-POT CLAVULANATE 875-125 MG PO TABS: 1.0000 | ORAL_TABLET | Freq: Two times a day (BID) | ORAL | 0 refills | Status: AC

## 2019-03-01 NOTE — Telephone Encounter (Signed)
Patient daughter aware. °

## 2019-03-01 NOTE — Telephone Encounter (Signed)
RX sent

## 2019-03-01 NOTE — Telephone Encounter (Signed)
Patient thinks she has strep throat. Can antibiotic please be called in?

## 2019-03-01 NOTE — Progress Notes (Signed)
phare

## 2019-03-12 ENCOUNTER — Telehealth: Payer: Self-pay | Admitting: Family

## 2019-03-12 NOTE — Telephone Encounter (Signed)
Called to offer a televisit but no answer.

## 2019-03-15 NOTE — Telephone Encounter (Signed)
Attempted to contact patient - NA and no call back- this encounter will be closed.

## 2019-03-20 ENCOUNTER — Ambulatory Visit: Payer: PPO | Admitting: Family

## 2019-03-22 NOTE — Patient Instructions (Signed)
Kathleen Saunders  03/22/2019     @PREFPERIOPPHARMACY @   Your procedure is scheduled on  03/30/2019 .  Report to [facility] at  0930  A.M.  Call this number if you have problems the morning of surgery:  [phone]   Remember:  Follow the diet and prep instructions given to you by Dr Olevia Perches office.                       Take these medicines the morning of surgery with A SIP OF WATER  Hydroxyzine, oxycodone(if needed), zoloft. Use your nebulizer and your inhaler before you come.    Do not wear jewelry, make-up or nail polish.  Do not wear lotions, powders, or perfumes. Please wear deodorant and brush your teeth.  Do not shave 48 hours prior to surgery.  Men may shave face and neck.  Do not bring valuables to the hospital.  [facility] is not responsible for any belongings or valuables.  Contacts, dentures or bridgework may not be worn into surgery.  Leave your suitcase in the car.  After surgery it may be brought to your room.  For patients admitted to the hospital, discharge time will be determined by your treatment team.  Patients discharged the day of surgery will not be allowed to drive home.   Name and phone number of your driver:   family Special instructions:  None  Please read over the following fact sheets that you were given. Anesthesia Post-op Instructions and Care and Recovery After Surgery       Upper Endoscopy, Adult, Care After This sheet gives you information about how to care for yourself after your procedure. Your health care provider may also give you more specific instructions. If you have problems or questions, contact your health care provider. What can I expect after the procedure? After the procedure, it is common to have:  A sore throat.  Mild stomach pain or discomfort.  Bloating.  Nausea. Follow these instructions at home:   Follow instructions from your health care provider about what to eat or drink after your procedure.   Return to your normal activities as told by your health care provider. Ask your health care provider what activities are safe for you.  Take over-the-counter and prescription medicines only as told by your health care provider.  Do not drive for 24 hours if you were given a sedative during your procedure.  Keep all follow-up visits as told by your health care provider. This is important. Contact a health care provider if you have:  A sore throat that lasts longer than one day.  Trouble swallowing. Get help right away if:  You vomit blood or your vomit looks like coffee grounds.  You have: ? A fever. ? Bloody, black, or tarry stools. ? A severe sore throat or you cannot swallow. ? Difficulty breathing. ? Severe pain in your chest or abdomen. Summary  After the procedure, it is common to have a sore throat, mild stomach discomfort, bloating, and nausea.  Do not drive for 24 hours if you were given a sedative during the procedure.  Follow instructions from your health care provider about what to eat or drink after your procedure.  Return to your normal activities as told by your health care provider. This information is not intended to replace advice given to you by your health care provider. Make sure you discuss any questions you have  with your health care provider. Document Released: 11/23/2011 Document Revised: 11/15/2017 Document Reviewed: 10/24/2017 Elsevier Patient Education  2020 [employer].  Colonoscopy, Adult, Care After This sheet gives you information about how to care for yourself after your procedure. Your health care provider may also give you more specific instructions. If you have problems or questions, contact your health care provider. What can I expect after the procedure? After the procedure, it is common to have:  A small amount of blood in your stool for 24 hours after the procedure.  Some gas.  Mild abdominal cramping or bloating. Follow these  instructions at home: General instructions  For the first 24 hours after the procedure: ? Do not drive or use machinery. ? Do not sign important documents. ? Do not drink alcohol. ? Do your regular daily activities at a slower pace than normal. ? Eat soft, easy-to-digest foods.  Take over-the-counter or prescription medicines only as told by your health care provider. Relieving cramping and bloating   Try walking around when you have cramps or feel bloated.  Apply heat to your abdomen as told by your health care provider. Use a heat source that your health care provider recommends, such as a moist heat pack or a heating pad. ? Place a towel between your skin and the heat source. ? Leave the heat on for 20-30 minutes. ? Remove the heat if your skin turns bright red. This is especially important if you are unable to feel pain, heat, or cold. You may have a greater risk of getting burned. Eating and drinking   Drink enough fluid to keep your urine pale yellow.  Resume your normal diet as instructed by your health care provider. Avoid heavy or fried foods that are hard to digest.  Avoid drinking alcohol for as long as instructed by your health care provider. Contact a health care provider if:  You have blood in your stool 2-3 days after the procedure. Get help right away if:  You have more than a small spotting of blood in your stool.  You pass large blood clots in your stool.  Your abdomen is swollen.  You have nausea or vomiting.  You have a fever.  You have increasing abdominal pain that is not relieved with medicine. Summary  After the procedure, it is common to have a small amount of blood in your stool. You may also have mild abdominal cramping and bloating.  For the first 24 hours after the procedure, do not drive or use machinery, sign important documents, or drink alcohol.  Contact your health care provider if you have a lot of blood in your stool, nausea or  vomiting, a fever, or increased abdominal pain. This information is not intended to replace advice given to you by your health care provider. Make sure you discuss any questions you have with your health care provider. Document Released: 01/06/2004 Document Revised: 03/16/2017 Document Reviewed: 08/05/2015 Elsevier Patient Education  2020 [employer] Monitored Anesthesia Care, Care After These instructions provide you with information about caring for yourself after your procedure. Your health care provider may also give you more specific instructions. Your treatment has been planned according to current medical practices, but problems sometimes occur. Call your health care provider if you have any problems or questions after your procedure. What can I expect after the procedure? After your procedure, you may:  Feel sleepy for several hours.  Feel clumsy and have poor balance for several hours.  Feel forgetful about  what happened after the procedure.  Have poor judgment for several hours.  Feel nauseous or vomit.  Have a sore throat if you had a breathing tube during the procedure. Follow these instructions at home: For at least 24 hours after the procedure:      Have a responsible adult stay with you. It is important to have someone help care for you until you are awake and alert.  Rest as needed.  Do not: ? Participate in activities in which you could fall or become injured. ? Drive. ? Use heavy machinery. ? Drink alcohol. ? Take sleeping pills or medicines that cause drowsiness. ? Make important decisions or sign legal documents. ? Take care of children on your own. Eating and drinking  Follow the diet that is recommended by your health care provider.  If you vomit, drink water, juice, or soup when you can drink without vomiting.  Make sure you have little or no nausea before eating solid foods. General instructions  Take over-the-counter and prescription  medicines only as told by your health care provider.  If you have sleep apnea, surgery and certain medicines can increase your risk for breathing problems. Follow instructions from your health care provider about wearing your sleep device: ? Anytime you are sleeping, including during daytime naps. ? While taking prescription pain medicines, sleeping medicines, or medicines that make you drowsy.  If you smoke, do not smoke without supervision.  Keep all follow-up visits as told by your health care provider. This is important. Contact a health care provider if:  You keep feeling nauseous or you keep vomiting.  You feel light-headed.  You develop a rash.  You have a fever. Get help right away if:  You have trouble breathing. Summary  For several hours after your procedure, you may feel sleepy and have poor judgment.  Have a responsible adult stay with you for at least 24 hours or until you are awake and alert. This information is not intended to replace advice given to you by your health care provider. Make sure you discuss any questions you have with your health care provider. Document Released: 09/14/2015 Document Revised: 08/22/2017 Document Reviewed: 09/14/2015 Elsevier Patient Education  2020 [employer]

## 2019-03-26 ENCOUNTER — Other Ambulatory Visit: Payer: Self-pay | Admitting: *Deleted

## 2019-03-26 DIAGNOSIS — M109 Gout, unspecified: Secondary | ICD-10-CM

## 2019-03-26 MED ORDER — COLCHICINE 0.6 MG PO TABS: ORAL_TABLET | ORAL | 1 refills | Status: AC

## 2019-03-28 ENCOUNTER — Encounter (INDEPENDENT_AMBULATORY_CARE_PROVIDER_SITE_OTHER): Payer: Self-pay

## 2019-03-28 ENCOUNTER — Other Ambulatory Visit: Payer: Self-pay

## 2019-03-28 ENCOUNTER — Encounter (HOSPITAL_COMMUNITY)
Admission: RE | Admit: 2019-03-28 | Discharge: 2019-03-28 | Disposition: A | Discharge status: Home / Self Care | Payer: PPO | Source: Ambulatory Visit | Attending: Internal Medicine | Admitting: Internal Medicine

## 2019-03-28 ENCOUNTER — Encounter (HOSPITAL_COMMUNITY): Payer: Self-pay

## 2019-03-28 ENCOUNTER — Other Ambulatory Visit (HOSPITAL_COMMUNITY)
Admission: RE | Admit: 2019-03-28 | Discharge: 2019-03-28 | Disposition: A | Discharge status: Home / Self Care | Payer: PPO | Source: Ambulatory Visit | Attending: Internal Medicine | Admitting: Internal Medicine

## 2019-03-28 DIAGNOSIS — Z20828 Contact with and (suspected) exposure to other viral communicable diseases: Secondary | ICD-10-CM | POA: Insufficient documentation

## 2019-03-28 DIAGNOSIS — Z01812 Encounter for preprocedural laboratory examination: Secondary | ICD-10-CM | POA: Diagnosis not present

## 2019-03-28 LAB — SARS CORONAVIRUS 2 (TAT 6-24 HRS): SARS Coronavirus 2: NEGATIVE

## 2019-03-30 ENCOUNTER — Encounter (INDEPENDENT_AMBULATORY_CARE_PROVIDER_SITE_OTHER): Payer: Self-pay | Admitting: *Deleted

## 2019-03-30 ENCOUNTER — Encounter (HOSPITAL_COMMUNITY): Admission: RE | Payer: Self-pay | Source: Home / Self Care

## 2019-03-30 ENCOUNTER — Ambulatory Visit (HOSPITAL_COMMUNITY): Admission: RE | Admit: 2019-03-30 | Payer: PPO | Source: Home / Self Care | Admitting: Internal Medicine

## 2019-03-30 ENCOUNTER — Other Ambulatory Visit (INDEPENDENT_AMBULATORY_CARE_PROVIDER_SITE_OTHER): Payer: Self-pay | Admitting: Internal Medicine

## 2019-03-30 ENCOUNTER — Telehealth (INDEPENDENT_AMBULATORY_CARE_PROVIDER_SITE_OTHER): Payer: Self-pay | Admitting: *Deleted

## 2019-03-30 DIAGNOSIS — Z1211 Encounter for screening for malignant neoplasm of colon: Secondary | ICD-10-CM

## 2019-03-30 DIAGNOSIS — R195 Other fecal abnormalities: Secondary | ICD-10-CM

## 2019-03-30 SURGERY — ESOPHAGOGASTRODUODENOSCOPY (EGD) WITH PROPOFOL
Anesthesia: Monitor Anesthesia Care

## 2019-03-30 MED ORDER — SUPREP BOWEL PREP KIT 17.5-3.13-1.6 GM/177ML PO SOLN: 1.0000 | Freq: Once | ORAL | 0 refills | Status: AC

## 2019-03-30 NOTE — Telephone Encounter (Signed)
Patient needs suprep TCS/EGD sch'd 11/16

## 2019-04-10 ENCOUNTER — Encounter: Payer: Self-pay | Admitting: Family

## 2019-04-10 ENCOUNTER — Ambulatory Visit (INDEPENDENT_AMBULATORY_CARE_PROVIDER_SITE_OTHER): Payer: PPO | Admitting: Family

## 2019-04-10 DIAGNOSIS — M159 Polyosteoarthritis, unspecified: Secondary | ICD-10-CM

## 2019-04-10 DIAGNOSIS — K219 Gastro-esophageal reflux disease without esophagitis: Secondary | ICD-10-CM | POA: Diagnosis not present

## 2019-04-10 DIAGNOSIS — M5441 Lumbago with sciatica, right side: Secondary | ICD-10-CM

## 2019-04-10 DIAGNOSIS — F411 Generalized anxiety disorder: Secondary | ICD-10-CM

## 2019-04-10 DIAGNOSIS — F1120 Opioid dependence, uncomplicated: Secondary | ICD-10-CM | POA: Diagnosis not present

## 2019-04-10 DIAGNOSIS — Z6841 Body Mass Index (BMI) 40.0 and over, adult: Secondary | ICD-10-CM

## 2019-04-10 DIAGNOSIS — G8929 Other chronic pain: Secondary | ICD-10-CM

## 2019-04-10 DIAGNOSIS — F331 Major depressive disorder, recurrent, moderate: Secondary | ICD-10-CM | POA: Diagnosis not present

## 2019-04-10 DIAGNOSIS — D649 Anemia, unspecified: Secondary | ICD-10-CM | POA: Diagnosis not present

## 2019-04-10 DIAGNOSIS — M8949 Other hypertrophic osteoarthropathy, multiple sites: Secondary | ICD-10-CM

## 2019-04-10 DIAGNOSIS — J4540 Moderate persistent asthma, uncomplicated: Secondary | ICD-10-CM | POA: Diagnosis not present

## 2019-04-10 DIAGNOSIS — E782 Mixed hyperlipidemia: Secondary | ICD-10-CM | POA: Diagnosis not present

## 2019-04-10 DIAGNOSIS — Z0289 Encounter for other administrative examinations: Secondary | ICD-10-CM | POA: Diagnosis not present

## 2019-04-10 DIAGNOSIS — I10 Essential (primary) hypertension: Secondary | ICD-10-CM | POA: Diagnosis not present

## 2019-04-10 MED ORDER — HYDROXYZINE PAMOATE 50 MG PO CAPS: 50.0000 mg | ORAL_CAPSULE | Freq: Three times a day (TID) | ORAL | 3 refills | Status: DC

## 2019-04-10 MED ORDER — OXYCODONE-ACETAMINOPHEN 7.5-325 MG PO TABS: 1.0000 | ORAL_TABLET | Freq: Four times a day (QID) | ORAL | 0 refills | Status: DC | PRN

## 2019-04-10 NOTE — Progress Notes (Signed)
Virtual Visit via telephone Note Due to COVID-19 pandemic this visit was conducted virtually. This visit type was conducted due to national recommendations for restrictions regarding the COVID-19 Pandemic (e.g. social distancing, sheltering in place) in an effort to limit this patient's exposure and mitigate transmission in our community. All issues noted in this document were discussed and addressed.  A physical exam was not performed with this format.  I connected with Kathleen Saunders on 04/10/19 at 8:27 AM by telephone and verified that I am speaking with the correct person using two identifiers. Kathleen Saunders is currently located at home and husband is currently with her during visit. The provider, Evelina Dun, FNP is located in their office at time of visit.  I discussed the limitations, risks, security and privacy concerns of performing an evaluation and management service by telephone and the availability of in person appointments. I also discussed with the patient that there may be a patient responsible charge related to this service. The patient expressed understanding and agreed to proceed.   History and Present Illness:  PT presents to the office today for chronic follow up. She also see's Ortho for bilateral knee pain and back pain. Followed by Hematologists for iron deficiency anemia and getting iron infusions.  Scheduled for colonoscopy 04/23/19.   Hypertension This is a chronic problem. The current episode started more than 1 year ago. The problem has been resolved since onset. The problem is controlled. Associated symptoms include anxiety and malaise/fatigue. Pertinent negatives include no shortness of breath. The current treatment provides moderate improvement.  Depression        This is a chronic problem.  The current episode started more than 1 year ago.   The onset quality is gradual.   The problem occurs intermittently.  The problem has been waxing and waning since onset.   Associated symptoms include irritable, restlessness, decreased interest and sad.  Associated symptoms include no helplessness and no hopelessness.  Past treatments include nothing.  Past medical history includes anxiety.   Anxiety Presents for follow-up visit. Symptoms include depressed mood, excessive worry, irritability, nervous/anxious behavior and restlessness. Patient reports no shortness of breath. Symptoms occur most days. The severity of symptoms is moderate.   Her past medical history is significant for asthma.  Asthma She complains of wheezing. There is no cough or shortness of breath. This is a chronic problem. The current episode started more than 1 year ago. The problem occurs intermittently. Associated symptoms include heartburn and malaise/fatigue. Pertinent negatives include no ear congestion or fever. Her symptoms are alleviated by rest. She reports moderate improvement on treatment. Her symptoms are not alleviated by rest. Her past medical history is significant for asthma.  Back Pain This is a chronic problem. The current episode started more than 1 year ago. The problem occurs daily. The problem has been waxing and waning since onset. The pain is present in the lumbar spine. The quality of the pain is described as aching. The pain is at a severity of 9/10. The pain is moderate. Associated symptoms include tingling. Pertinent negatives include no fever. Risk factors include sedentary lifestyle, lack of exercise and obesity. She has tried analgesics and bed rest for the symptoms. The treatment provided mild relief.  Gastroesophageal Reflux She complains of belching, heartburn and wheezing. She reports no coughing. This is a chronic problem. The current episode started more than 1 year ago. The problem occurs occasionally. The problem has been waxing and waning. She has tried  a PPI for the symptoms. The treatment provided mild relief.      Review of Systems  Constitutional: Positive  for irritability and malaise/fatigue. Negative for fever.  Respiratory: Positive for wheezing. Negative for cough and shortness of breath.   Gastrointestinal: Positive for heartburn.  Musculoskeletal: Positive for back pain.  Neurological: Positive for tingling.  Psychiatric/Behavioral: Positive for depression. The patient is nervous/anxious.   All other systems reviewed and are negative.    Observations/Objective: No SOB or distress noted  Assessment and Plan: 1. Essential hypertension  2. Moderate persistent asthma without complication  3. Gastroesophageal reflux disease, unspecified whether esophagitis present  4. Primary osteoarthritis involving multiple joints - oxyCODONE-acetaminophen (PERCOCET) 7.5-325 MG tablet; Take 1 tablet by mouth every 6 (six) hours as needed for severe pain.  Dispense: 120 tablet; Refill: 0 - oxyCODONE-acetaminophen (PERCOCET) 7.5-325 MG tablet; Take 1 tablet by mouth every 6 (six) hours as needed for severe pain.  Dispense: 120 tablet; Refill: 0 - oxyCODONE-acetaminophen (PERCOCET) 7.5-325 MG tablet; Take 1 tablet by mouth every 6 (six) hours as needed for severe pain.  Dispense: 120 tablet; Refill: 0  5. GAD (generalized anxiety disorder) - hydrOXYzine (VISTARIL) 50 MG capsule; Take 1 capsule (50 mg total) by mouth 3 (three) times daily.  Dispense: 60 capsule; Refill: 3  6. Moderate episode of recurrent major depressive disorder (HCC)  7. Morbid obesity with BMI of 50.0-59.9, adult (HCC)  8. Mixed hyperlipidemia  9. Uncomplicated opioid dependence (HCC) - oxyCODONE-acetaminophen (PERCOCET) 7.5-325 MG tablet; Take 1 tablet by mouth every 6 (six) hours as needed for severe pain.  Dispense: 120 tablet; Refill: 0 - oxyCODONE-acetaminophen (PERCOCET) 7.5-325 MG tablet; Take 1 tablet by mouth every 6 (six) hours as needed for severe pain.  Dispense: 120 tablet; Refill: 0 - oxyCODONE-acetaminophen (PERCOCET) 7.5-325 MG tablet; Take 1 tablet by mouth  every 6 (six) hours as needed for severe pain.  Dispense: 120 tablet; Refill: 0  10. Pain medication agreement signed - oxyCODONE-acetaminophen (PERCOCET) 7.5-325 MG tablet; Take 1 tablet by mouth every 6 (six) hours as needed for severe pain.  Dispense: 120 tablet; Refill: 0 - oxyCODONE-acetaminophen (PERCOCET) 7.5-325 MG tablet; Take 1 tablet by mouth every 6 (six) hours as needed for severe pain.  Dispense: 120 tablet; Refill: 0 - oxyCODONE-acetaminophen (PERCOCET) 7.5-325 MG tablet; Take 1 tablet by mouth every 6 (six) hours as needed for severe pain.  Dispense: 120 tablet; Refill: 0  11. Chronic bilateral low back pain with right-sided sciatica - oxyCODONE-acetaminophen (PERCOCET) 7.5-325 MG tablet; Take 1 tablet by mouth every 6 (six) hours as needed for severe pain.  Dispense: 120 tablet; Refill: 0 - oxyCODONE-acetaminophen (PERCOCET) 7.5-325 MG tablet; Take 1 tablet by mouth every 6 (six) hours as needed for severe pain.  Dispense: 120 tablet; Refill: 0 - oxyCODONE-acetaminophen (PERCOCET) 7.5-325 MG tablet; Take 1 tablet by mouth every 6 (six) hours as needed for severe pain.  Dispense: 120 tablet; Refill: 0  12. Normocytic anemia  Pt reviewed in [place] controlled database- No red flags noted. Labs reviewed Keep specialists appts RTO In 3 months      I discussed the assessment and treatment plan with the patient. The patient was provided an opportunity to ask questions and all were answered. The patient agreed with the plan and demonstrated an understanding of the instructions.   The patient was advised to call back or seek an in-person evaluation if the symptoms worsen or if the condition fails to improve as anticipated.  The above assessment  and management plan was discussed with the patient. The patient verbalized understanding of and has agreed to the management plan. Patient is aware to call the clinic if symptoms persist or worsen. Patient is aware when to return to the clinic for  a follow-up visit. Patient educated on when it is appropriate to go to the emergency department.   Time call ended: 8:51 AM    I provided 24 minutes of non-face-to-face time during this encounter.    Jannifer Rodney, FNP

## 2019-04-17 ENCOUNTER — Other Ambulatory Visit (HOSPITAL_COMMUNITY): Payer: PPO

## 2019-04-17 ENCOUNTER — Ambulatory Visit (HOSPITAL_COMMUNITY): Payer: PPO | Admitting: Nurse Practitioner

## 2019-04-17 NOTE — Patient Instructions (Signed)
Kathleen Saunders  04/17/2019     @PREFPERIOPPHARMACY @   Your procedure is scheduled on  04/23/2019.  Report to 04/25/2019 at  [phone]  A.M.  Call this number if you have problems the morning of surgery:  [phone]   Remember:  Follow the diet and prep instructions given to you by Dr [phone] office.                     Take these medicines the morning of surgery with A SIP OF WATER  Colchicine, hydroxyzine, robaxin(if needed), oxycodone(if needed), zoloft.   Do not wear jewelry, make-up or nail polish.  Do not wear lotions, powders, or perfumes. Please wear deodorant and brush your teeth.  Do not shave 48 hours prior to surgery.  Men may shave face and neck.  Do not bring valuables to the hospital.  [facility] is not responsible for any belongings or valuables.  Contacts, dentures or bridgework may not be worn into surgery.  Leave your suitcase in the car.  After surgery it may be brought to your room.  For patients admitted to the hospital, discharge time will be determined by your treatment team.  Patients discharged the day of surgery will not be allowed to drive home.   Name and phone number of your driver:   family Special instructions:  None  Please read over the following fact sheets that you were given. Anesthesia Post-op Instructions and Care and Recovery After Surgery       Upper Endoscopy, Adult, Care After This sheet gives you information about how to care for yourself after your procedure. Your health care provider may also give you more specific instructions. If you have problems or questions, contact your health care provider. What can I expect after the procedure? After the procedure, it is common to have:  A sore throat.  Mild stomach pain or discomfort.  Bloating.  Nausea. Follow these instructions at home:   Follow instructions from your health care provider about what to eat or drink after your procedure.  Return to your normal  activities as told by your health care provider. Ask your health care provider what activities are safe for you.  Take over-the-counter and prescription medicines only as told by your health care provider.  Do not drive for 24 hours if you were given a sedative during your procedure.  Keep all follow-up visits as told by your health care provider. This is important. Contact a health care provider if you have:  A sore throat that lasts longer than one day.  Trouble swallowing. Get help right away if:  You vomit blood or your vomit looks like coffee grounds.  You have: ? A fever. ? Bloody, black, or tarry stools. ? A severe sore throat or you cannot swallow. ? Difficulty breathing. ? Severe pain in your chest or abdomen. Summary  After the procedure, it is common to have a sore throat, mild stomach discomfort, bloating, and nausea.  Do not drive for 24 hours if you were given a sedative during the procedure.  Follow instructions from your health care provider about what to eat or drink after your procedure.  Return to your normal activities as told by your health care provider. This information is not intended to replace advice given to you by your health care provider. Make sure you discuss any questions you have with your health care provider. Document Released: 11/23/2011 Document Revised: 11/15/2017  Document Reviewed: 10/24/2017 Elsevier Patient Education  2020 [employer]  Colonoscopy, Adult, Care After This sheet gives you information about how to care for yourself after your procedure. Your health care provider may also give you more specific instructions. If you have problems or questions, contact your health care provider. What can I expect after the procedure? After the procedure, it is common to have:  A small amount of blood in your stool for 24 hours after the procedure.  Some gas.  Mild abdominal cramping or bloating. Follow these instructions at home:  General instructions  For the first 24 hours after the procedure: ? Do not drive or use machinery. ? Do not sign important documents. ? Do not drink alcohol. ? Do your regular daily activities at a slower pace than normal. ? Eat soft, easy-to-digest foods.  Take over-the-counter or prescription medicines only as told by your health care provider. Relieving cramping and bloating   Try walking around when you have cramps or feel bloated.  Apply heat to your abdomen as told by your health care provider. Use a heat source that your health care provider recommends, such as a moist heat pack or a heating pad. ? Place a towel between your skin and the heat source. ? Leave the heat on for 20-30 minutes. ? Remove the heat if your skin turns bright red. This is especially important if you are unable to feel pain, heat, or cold. You may have a greater risk of getting burned. Eating and drinking   Drink enough fluid to keep your urine pale yellow.  Resume your normal diet as instructed by your health care provider. Avoid heavy or fried foods that are hard to digest.  Avoid drinking alcohol for as long as instructed by your health care provider. Contact a health care provider if:  You have blood in your stool 2-3 days after the procedure. Get help right away if:  You have more than a small spotting of blood in your stool.  You pass large blood clots in your stool.  Your abdomen is swollen.  You have nausea or vomiting.  You have a fever.  You have increasing abdominal pain that is not relieved with medicine. Summary  After the procedure, it is common to have a small amount of blood in your stool. You may also have mild abdominal cramping and bloating.  For the first 24 hours after the procedure, do not drive or use machinery, sign important documents, or drink alcohol.  Contact your health care provider if you have a lot of blood in your stool, nausea or vomiting, a fever, or  increased abdominal pain. This information is not intended to replace advice given to you by your health care provider. Make sure you discuss any questions you have with your health care provider. Document Released: 01/06/2004 Document Revised: 03/16/2017 Document Reviewed: 08/05/2015 Elsevier Patient Education  2020 [employer] Monitored Anesthesia Care, Care After These instructions provide you with information about caring for yourself after your procedure. Your health care provider may also give you more specific instructions. Your treatment has been planned according to current medical practices, but problems sometimes occur. Call your health care provider if you have any problems or questions after your procedure. What can I expect after the procedure? After your procedure, you may:  Feel sleepy for several hours.  Feel clumsy and have poor balance for several hours.  Feel forgetful about what happened after the procedure.  Have poor judgment for several  hours.  Feel nauseous or vomit.  Have a sore throat if you had a breathing tube during the procedure. Follow these instructions at home: For at least 24 hours after the procedure:      Have a responsible adult stay with you. It is important to have someone help care for you until you are awake and alert.  Rest as needed.  Do not: ? Participate in activities in which you could fall or become injured. ? Drive. ? Use heavy machinery. ? Drink alcohol. ? Take sleeping pills or medicines that cause drowsiness. ? Make important decisions or sign legal documents. ? Take care of children on your own. Eating and drinking  Follow the diet that is recommended by your health care provider.  If you vomit, drink water, juice, or soup when you can drink without vomiting.  Make sure you have little or no nausea before eating solid foods. General instructions  Take over-the-counter and prescription medicines only as told by your  health care provider.  If you have sleep apnea, surgery and certain medicines can increase your risk for breathing problems. Follow instructions from your health care provider about wearing your sleep device: ? Anytime you are sleeping, including during daytime naps. ? While taking prescription pain medicines, sleeping medicines, or medicines that make you drowsy.  If you smoke, do not smoke without supervision.  Keep all follow-up visits as told by your health care provider. This is important. Contact a health care provider if:  You keep feeling nauseous or you keep vomiting.  You feel light-headed.  You develop a rash.  You have a fever. Get help right away if:  You have trouble breathing. Summary  For several hours after your procedure, you may feel sleepy and have poor judgment.  Have a responsible adult stay with you for at least 24 hours or until you are awake and alert. This information is not intended to replace advice given to you by your health care provider. Make sure you discuss any questions you have with your health care provider. Document Released: 09/14/2015 Document Revised: 08/22/2017 Document Reviewed: 09/14/2015 Elsevier Patient Education  2020 [employer].

## 2019-04-19 ENCOUNTER — Encounter (HOSPITAL_COMMUNITY)
Admission: RE | Admit: 2019-04-19 | Discharge: 2019-04-19 | Disposition: A | Discharge status: Home / Self Care | Payer: PPO | Source: Ambulatory Visit | Attending: Internal Medicine | Admitting: Internal Medicine

## 2019-04-19 ENCOUNTER — Encounter (HOSPITAL_COMMUNITY): Payer: Self-pay

## 2019-04-19 ENCOUNTER — Other Ambulatory Visit: Payer: Self-pay

## 2019-04-19 ENCOUNTER — Other Ambulatory Visit (HOSPITAL_COMMUNITY)
Admission: RE | Admit: 2019-04-19 | Discharge: 2019-04-19 | Disposition: A | Discharge status: Home / Self Care | Payer: PPO | Source: Ambulatory Visit | Attending: Internal Medicine | Admitting: Internal Medicine

## 2019-04-19 DIAGNOSIS — Z01812 Encounter for preprocedural laboratory examination: Secondary | ICD-10-CM | POA: Insufficient documentation

## 2019-04-19 HISTORY — DX: Gout, unspecified: M10.9

## 2019-04-19 LAB — BASIC METABOLIC PANEL
Anion gap: 10 (ref 5–15)
BUN: 31 mg/dL — ABNORMAL HIGH (ref 8–23)
CO2: 27 mmol/L (ref 22–32)
Calcium: 9 mg/dL (ref 8.9–10.3)
Chloride: 100 mmol/L (ref 98–111)
Creatinine, Ser: 1.29 mg/dL — ABNORMAL HIGH (ref 0.44–1.00)
GFR calc Af Amer: 50 mL/min — ABNORMAL LOW (ref >60)
GFR calc non Af Amer: 43 mL/min — ABNORMAL LOW (ref >60)
Glucose, Bld: 102 mg/dL — ABNORMAL HIGH (ref 70–99)
Potassium: 4.7 mmol/L (ref 3.5–5.1)
Sodium: 137 mmol/L (ref 135–145)

## 2019-04-20 ENCOUNTER — Other Ambulatory Visit (HOSPITAL_COMMUNITY): Payer: PPO

## 2019-04-20 ENCOUNTER — Other Ambulatory Visit (HOSPITAL_COMMUNITY)
Admission: RE | Admit: 2019-04-20 | Discharge: 2019-04-20 | Disposition: A | Discharge status: Home / Self Care | Payer: PPO | Source: Ambulatory Visit | Attending: Internal Medicine | Admitting: Internal Medicine

## 2019-04-20 DIAGNOSIS — Z20828 Contact with and (suspected) exposure to other viral communicable diseases: Secondary | ICD-10-CM | POA: Diagnosis not present

## 2019-04-20 DIAGNOSIS — Z01812 Encounter for preprocedural laboratory examination: Secondary | ICD-10-CM | POA: Diagnosis not present

## 2019-04-20 LAB — SARS CORONAVIRUS 2 (TAT 6-24 HRS): SARS Coronavirus 2: NEGATIVE

## 2019-04-23 ENCOUNTER — Ambulatory Visit (HOSPITAL_COMMUNITY): Payer: PPO | Admitting: Anesthesiology

## 2019-04-23 ENCOUNTER — Encounter (HOSPITAL_COMMUNITY): Payer: Self-pay | Admitting: *Deleted

## 2019-04-23 ENCOUNTER — Ambulatory Visit (HOSPITAL_COMMUNITY)
Admission: RE | Admit: 2019-04-23 | Discharge: 2019-04-23 | Disposition: A | Discharge status: Home / Self Care | Payer: PPO | Attending: Internal Medicine | Admitting: Internal Medicine

## 2019-04-23 ENCOUNTER — Encounter (HOSPITAL_COMMUNITY)
Admission: RE | Disposition: A | Discharge status: Home / Self Care | Payer: Self-pay | Source: Home / Self Care | Attending: Internal Medicine

## 2019-04-23 DIAGNOSIS — J45909 Unspecified asthma, uncomplicated: Secondary | ICD-10-CM | POA: Diagnosis not present

## 2019-04-23 DIAGNOSIS — F419 Anxiety disorder, unspecified: Secondary | ICD-10-CM | POA: Insufficient documentation

## 2019-04-23 DIAGNOSIS — D509 Iron deficiency anemia, unspecified: Secondary | ICD-10-CM | POA: Insufficient documentation

## 2019-04-23 DIAGNOSIS — I10 Essential (primary) hypertension: Secondary | ICD-10-CM | POA: Insufficient documentation

## 2019-04-23 DIAGNOSIS — Z79899 Other long term (current) drug therapy: Secondary | ICD-10-CM | POA: Insufficient documentation

## 2019-04-23 DIAGNOSIS — K644 Residual hemorrhoidal skin tags: Secondary | ICD-10-CM

## 2019-04-23 DIAGNOSIS — G4733 Obstructive sleep apnea (adult) (pediatric): Secondary | ICD-10-CM | POA: Insufficient documentation

## 2019-04-23 DIAGNOSIS — M109 Gout, unspecified: Secondary | ICD-10-CM | POA: Insufficient documentation

## 2019-04-23 DIAGNOSIS — M1990 Unspecified osteoarthritis, unspecified site: Secondary | ICD-10-CM | POA: Insufficient documentation

## 2019-04-23 DIAGNOSIS — Z87891 Personal history of nicotine dependence: Secondary | ICD-10-CM | POA: Diagnosis not present

## 2019-04-23 DIAGNOSIS — E785 Hyperlipidemia, unspecified: Secondary | ICD-10-CM | POA: Insufficient documentation

## 2019-04-23 DIAGNOSIS — Z9884 Bariatric surgery status: Secondary | ICD-10-CM | POA: Insufficient documentation

## 2019-04-23 DIAGNOSIS — K625 Hemorrhage of anus and rectum: Secondary | ICD-10-CM | POA: Diagnosis not present

## 2019-04-23 DIAGNOSIS — K219 Gastro-esophageal reflux disease without esophagitis: Secondary | ICD-10-CM | POA: Insufficient documentation

## 2019-04-23 DIAGNOSIS — F329 Major depressive disorder, single episode, unspecified: Secondary | ICD-10-CM | POA: Insufficient documentation

## 2019-04-23 DIAGNOSIS — K921 Melena: Secondary | ICD-10-CM | POA: Insufficient documentation

## 2019-04-23 DIAGNOSIS — R195 Other fecal abnormalities: Secondary | ICD-10-CM

## 2019-04-23 HISTORY — PX: COLONOSCOPY WITH PROPOFOL: SHX5780

## 2019-04-23 HISTORY — PX: ESOPHAGOGASTRODUODENOSCOPY (EGD) WITH PROPOFOL: SHX5813

## 2019-04-23 HISTORY — PX: BIOPSY: SHX5522

## 2019-04-23 SURGERY — COLONOSCOPY WITH PROPOFOL
Anesthesia: General

## 2019-04-23 MED ORDER — FERROUS SULFATE 325 (65 FE) MG PO TABS: 325.0000 mg | ORAL_TABLET | Freq: Every day | ORAL | 3 refills | Status: DC

## 2019-04-23 MED ORDER — PROPOFOL 10 MG/ML IV BOLUS
INTRAVENOUS | Status: AC
  Filled 2019-04-23: qty 160

## 2019-04-23 MED ORDER — LIDOCAINE 2% (20 MG/ML) 5 ML SYRINGE
INTRAMUSCULAR | Status: AC
  Filled 2019-04-23: qty 20

## 2019-04-23 MED ORDER — ONDANSETRON HCL 4 MG/2ML IJ SOLN: 4.0000 mg | Freq: Once | INTRAMUSCULAR | Status: DC | PRN

## 2019-04-23 MED ORDER — ONDANSETRON HCL 4 MG/2ML IJ SOLN
INTRAMUSCULAR | Status: AC
  Filled 2019-04-23: qty 2

## 2019-04-23 MED ORDER — GLYCOPYRROLATE PF 0.2 MG/ML IJ SOSY
PREFILLED_SYRINGE | INTRAMUSCULAR | Status: AC
  Filled 2019-04-23: qty 1

## 2019-04-23 MED ORDER — CHLORHEXIDINE GLUCONATE CLOTH 2 % EX PADS: 6.0000 | MEDICATED_PAD | Freq: Once | CUTANEOUS | Status: DC

## 2019-04-23 MED ORDER — LACTATED RINGERS IV SOLN
INTRAVENOUS | Status: DC
  Administered 2019-04-23: 07:00:00 1000 mL via INTRAVENOUS

## 2019-04-23 MED ORDER — PROPOFOL 500 MG/50ML IV EMUL
INTRAVENOUS | Status: DC | PRN
  Administered 2019-04-23: 150 ug/kg/min via INTRAVENOUS

## 2019-04-23 MED ORDER — KETAMINE HCL 10 MG/ML IJ SOLN
INTRAMUSCULAR | Status: DC | PRN
  Administered 2019-04-23: 10 mg via INTRAVENOUS

## 2019-04-23 MED ORDER — GLYCOPYRROLATE 0.2 MG/ML IJ SOLN
INTRAMUSCULAR | Status: DC | PRN
  Administered 2019-04-23: 0.2 mg via INTRAVENOUS

## 2019-04-23 MED ORDER — ONDANSETRON HCL 4 MG/2ML IJ SOLN
INTRAMUSCULAR | Status: DC | PRN
  Administered 2019-04-23: 4 mg via INTRAVENOUS

## 2019-04-23 MED ORDER — PROPOFOL 10 MG/ML IV BOLUS
INTRAVENOUS | Status: DC | PRN
  Administered 2019-04-23: 100 mg via INTRAVENOUS

## 2019-04-23 MED ORDER — LIDOCAINE VISCOUS HCL 2 % MT SOLN
OROMUCOSAL | Status: DC | PRN
  Administered 2019-04-23: 1 via OROMUCOSAL

## 2019-04-23 MED ORDER — LIDOCAINE VISCOUS HCL 2 % MT SOLN
OROMUCOSAL | Status: AC
  Filled 2019-04-23: qty 15

## 2019-04-23 MED ORDER — LIDOCAINE HCL (PF) 2 % IJ SOLN
INTRAMUSCULAR | Status: DC | PRN
  Administered 2019-04-23: 60 mg

## 2019-04-23 MED ORDER — KETAMINE HCL 50 MG/5ML IJ SOSY
PREFILLED_SYRINGE | INTRAMUSCULAR | Status: AC
  Filled 2019-04-23: qty 5

## 2019-04-23 NOTE — Anesthesia Postprocedure Evaluation (Signed)
Anesthesia Post Note  Patient: Kathleen Saunders  Procedure(s) Performed: COLONOSCOPY WITH PROPOFOL (N/A ) ESOPHAGOGASTRODUODENOSCOPY (EGD) WITH PROPOFOL (N/A ) BIOPSY  Patient location during evaluation: PACU Anesthesia Type: General Level of consciousness: awake and alert and oriented Pain management: pain level controlled Vital Signs Assessment: post-procedure vital signs reviewed and stable Respiratory status: spontaneous breathing, nonlabored ventilation, respiratory function stable and patient connected to nasal cannula oxygen Cardiovascular status: blood pressure returned to baseline and stable Postop Assessment: no apparent nausea or vomiting Anesthetic complications: no     Last Vitals:  Vitals:   04/23/19 0800 04/23/19 0815  BP: 109/68 127/84  Pulse: 70 67  Resp: 17 16  Temp: 36.5 C   SpO2: 97% 95%    Last Pain:  Vitals:   04/23/19 0815  TempSrc:   PainSc: 0-No pain                 Talitha Givens

## 2019-04-23 NOTE — Transfer of Care (Signed)
Immediate Anesthesia Transfer of Care Note  Patient: Kathleen Saunders  Procedure(s) Performed: COLONOSCOPY WITH PROPOFOL (N/A ) ESOPHAGOGASTRODUODENOSCOPY (EGD) WITH PROPOFOL (N/A ) BIOPSY  Patient Location: PACU  Anesthesia Type:MAC  Level of Consciousness: awake, alert  and oriented  Airway & Oxygen Therapy: Patient Spontanous Breathing  Post-op Assessment: Report given to RN, Post -op Vital signs reviewed and stable and Patient moving all extremities X 4  Post vital signs: Reviewed and stable  Last Vitals:  Vitals Value Taken Time  BP 109/68 04/23/19 0800  Temp    Pulse 63 04/23/19 0802  Resp 10 04/23/19 0802  SpO2 96 % 04/23/19 0802  Vitals shown include unvalidated device data.  Last Pain:  Vitals:   04/23/19 0638  TempSrc: Oral  PainSc: 3       Patients Stated Pain Goal: 8 (65/79/03 8333)  Complications: No apparent anesthesia complications

## 2019-04-23 NOTE — Anesthesia Preprocedure Evaluation (Addendum)
Anesthesia Evaluation  Patient identified by MRN, date of birth, ID band Patient awake    Reviewed: Allergy & Precautions, NPO status , Patient's Chart, lab work & pertinent test results  History of Anesthesia Complications Negative for: history of anesthetic complications  Airway Mallampati: III  TM Distance: >3 FB Neck ROM: Full    Dental no notable dental hx.    Pulmonary asthma , sleep apnea and Continuous Positive Airway Pressure Ventilation , former smoker,    Pulmonary exam normal breath sounds clear to auscultation       Cardiovascular hypertension, Pt. on medications Normal cardiovascular exam Rhythm:Regular Rate:Normal     Neuro/Psych PSYCHIATRIC DISORDERS Anxiety Depression  Neuromuscular disease    GI/Hepatic Neg liver ROS, GERD  Medicated and Controlled,H/o gastric bypass   Endo/Other  Morbid obesity  Renal/GU negative Renal ROS     Musculoskeletal  (+) Arthritis ,   Abdominal   Peds  Hematology  (+) anemia ,   Anesthesia Other Findings   Reproductive/Obstetrics                            Anesthesia Physical Anesthesia Plan  ASA: III  Anesthesia Plan: General   Post-op Pain Management:    Induction: Intravenous  PONV Risk Score and Plan: TIVA  Airway Management Planned: Natural Airway and Nasal Cannula  Additional Equipment:   Intra-op Plan:   Post-operative Plan:   Informed Consent: I have reviewed the patients History and Physical, chart, labs and discussed the procedure including the risks, benefits and alternatives for the proposed anesthesia with the patient or authorized representative who has indicated his/her understanding and acceptance.     Dental advisory given  Plan Discussed with: CRNA  Anesthesia Plan Comments:         Anesthesia Quick Evaluation

## 2019-04-23 NOTE — Op Note (Signed)
[facility] Patient Name: Kathleen Saunders Procedure Date: 04/23/2019 7:14 AM MRN: [id number] Date of Birth: 11-Nov-1950 Attending MD: Lionel DecemberNajeeb Freedom Peddy , MD CSN: [id number] Age: [id number] Admit Type: Outpatient Procedure:                Upper GI endoscopy Indications:              Iron deficiency anemia Providers:                Lionel DecemberNajeeb Alixandrea Milleson, MD, Buel ReamAngela A. Thomasena Edisollins RN, RN, Dyann Ruddleonya                            Wilson Referring MD:             Edilia Bohristy A. Hawks, FNP Medicines:                Propofol per Anesthesia Complications:            No immediate complications. Estimated Blood Loss:     Estimated blood loss was minimal. Procedure:                Pre-Anesthesia Assessment:                           - Prior to the procedure, a History and Physical                            was performed, and patient medications and                            allergies were reviewed. The patient's tolerance of                            previous anesthesia was also reviewed. The risks                            and benefits of the procedure and the sedation                            options and risks were discussed with the patient.                            All questions were answered, and informed consent                            was obtained. Prior Anticoagulants: The patient has                            taken no previous anticoagulant or antiplatelet                            agents except for NSAID medication. ASA Grade                            Assessment: III - A patient with severe systemic  disease. After reviewing the risks and benefits,                            the patient was deemed in satisfactory condition to                            undergo the procedure.                           After obtaining informed consent, the endoscope was                            passed under direct vision. Throughout the                            procedure, the patient's blood  pressure, pulse, and                            oxygen saturations were monitored continuously. The                            GIF-H190 ([id number]) was introduced through the                            mouth, and advanced to the proximal jejunum. The                            upper GI endoscopy was accomplished without                            difficulty. The patient tolerated the procedure                            well. Scope In: 7:31:14 AM Scope Out: 7:37:19 AM Total Procedure Duration: 0 hours 6 minutes 5 seconds  Findings:      The examined esophagus was normal.      The Z-line was regular and was found 40 cm from the incisors.      Evidence of a gastric bypass was found. A gastric pouch with a normal       size was found. The gastrojejunal anastomosis was characterized by       healthy appearing mucosa. This was traversed.      The mucosa of pouch was normal.      The examined jejunum was normal. Biopsies for histology were taken with       a cold forceps for evaluation of celiac disease. Impression:               - Normal esophagus.                           - Z-line regular, 40 cm from the incisors.                           - Gastric bypass with a normal-sized pouch.  Gastrojejunal anastomosis characterized by healthy                            appearing mucosa.                           - Normal mucosa of gastric pouch..                           - Normal examined jejunum. Biopsied. Moderate Sedation:      Per Anesthesia Care Recommendation:           - Patient has a contact number available for                            emergencies. The signs and symptoms of potential                            delayed complications were discussed with the                            patient. Return to normal activities tomorrow.                            Written discharge instructions were provided to the                            patient.                            - Resume previous diet today.                           - Continue present medications.                           - Await pathology results.                           - See the other procedure note for documentation of                            additional recommendations. Procedure Code(s):        --- Professional ---                           [phone], Esophagogastroduodenoscopy, flexible,                            transoral; with biopsy, single or multiple Diagnosis Code(s):        --- Professional ---                           Z98.84, Bariatric surgery status                           D50.9, Iron deficiency anemia, unspecified CPT copyright 2019 American Medical Association. All rights reserved.  The codes documented in this report are preliminary and upon coder review may  be revised to meet current compliance requirements. Hildred Laser, MD Hildred Laser, MD 04/23/2019 8:08:52 AM This report has been signed electronically. Number of Addenda: 0

## 2019-04-23 NOTE — H&P (Signed)
Kathleen Saunders is an 68 y.o. female.   Chief Complaint: Patient is here for esophagogastroduodenoscopy and colonoscopy. HPI: Patient is 68 year old Caucasian female who presents with iron deficiency anemia and she was noted to have heme positive stool.  She reports intermittent hematochezia in the form of blood in the tissue when she is constipated.  There is obvious moderate amount.  She denies frank rectal bleeding or melena.  She had bariatric surgery in 2005.  She states she managed to lose 100 pounds but she has gained 75 pounds back.  She was also having intermittent heartburn but she has gotten better with therapy.  She denies nausea vomiting dysphagia abdominal pain or diarrhea.  She has never been screened for CRC. She takes Advil maybe once a week.  She usually takes 600 mg at a time. Family history is negative for CRC or inflammatory bowel disease.  Past Medical History:  Diagnosis Date  . Anxiety   . Arthritis   . Depression   . Essential hypertension   . Gout   . Hyperlipidemia   . OSA (obstructive sleep apnea)     Past Surgical History:  Procedure Laterality Date  . ABDOMINAL HYSTERECTOMY    . CARPAL TUNNEL RELEASE    . CHOLECYSTECTOMY    . GASTRIC BYPASS    . PLANTAR FASCIA RELEASE Right   . REPLACEMENT TOTAL KNEE Right     Family History  Problem Relation Age of Onset  . Arthritis Mother   . Heart disease Father   . Heart attack Father        Died at 23 MI  . Stroke Sister   . Heart attack Brother   . Heart attack Maternal Uncle   . Cancer Maternal Uncle   . Heart attack Maternal Grandfather   . Diabetes Paternal Grandmother   . Cancer Paternal Grandfather    Social History:  reports that she quit smoking about 31 years ago. Her smoking use included cigarettes. She has never used smokeless tobacco. She reports that she does not drink alcohol or use drugs.  Allergies:  Allergies  Allergen Reactions  . Simvastatin Other (See Comments)    Body aches     Medications Prior to Admission  Medication Sig Dispense Refill  . albuterol (PROVENTIL) (2.5 MG/3ML) 0.083% nebulizer solution Take 3 mLs (2.5 mg total) by nebulization every 6 (six) hours as needed for wheezing or shortness of breath. 150 mL 1  . albuterol (VENTOLIN HFA) 108 (90 Base) MCG/ACT inhaler Inhale 1-2 puffs into the lungs every 6 (six) hours as needed for wheezing or shortness of breath.    . hydrOXYzine (VISTARIL) 50 MG capsule Take 1 capsule (50 mg total) by mouth 3 (three) times daily. 60 capsule 3  . lisinopril-hydrochlorothiazide (ZESTORETIC) 20-25 MG tablet TAKE 1 TABLET BY MOUTH DAILY (Patient taking differently: Take 1 tablet by mouth daily. ) 90 tablet 1  . methocarbamol (ROBAXIN) 500 MG tablet TAKE 1 TABLET(500 MG) BY MOUTH EVERY 6 HOURS AS NEEDED FOR MUSCLE SPASMS (Patient taking differently: Take 500 mg by mouth every 6 (six) hours as needed for muscle spasms. ) 120 tablet 2  . omeprazole (PRILOSEC) 40 MG capsule Take 1 capsule (40 mg total) by mouth daily. (Patient taking differently: Take 40 mg by mouth daily before supper. ) 90 capsule 3  . oxyCODONE-acetaminophen (PERCOCET) 7.5-325 MG tablet Take 1 tablet by mouth every 6 (six) hours as needed for severe pain. 120 tablet 0  . sertraline (ZOLOFT) 50 MG  tablet TAKE 1 TABLET(50 MG) BY MOUTH DAILY 90 tablet 0  . colchicine 0.6 MG tablet 1.2 mg then one hour later take 0.6 mg if still having pain. Max 1.8 mg/day (Patient taking differently: Take 0.6-1.2 mg by mouth daily as needed (for gout pains (Max 1.8 mg/day)). ) 20 tablet 1  . furosemide (LASIX) 20 MG tablet TAKE 1 TABLET(20 MG) BY MOUTH DAILY (Patient taking differently: Take 20 mg by mouth daily as needed (fluid retention.). ) 90 tablet 0  . guaiFENesin (MUCINEX) 600 MG 12 hr tablet Take 600 mg by mouth 2 (two) times daily as needed (congestion.).      No results found for this or any previous visit (from the past 48 hour(s)). No results found.  ROS  Blood  pressure (!) 141/53, pulse (!) 58, temperature 98.1 F (36.7 C), temperature source Oral, resp. rate 20, height 5' 3.5" (1.613 m), weight 124 kg, SpO2 95 %. Physical Exam  Constitutional:  Obese Caucasian female in NAD.  HENT:  Mouth/Throat: Oropharynx is clear and moist.  Eyes: Conjunctivae are normal. No scleral icterus.  Neck: No thyromegaly present.  Cardiovascular: Normal rate, regular rhythm and normal heart sounds.  No murmur heard. Respiratory: Effort normal and breath sounds normal.  GI:  Abdomen is full with lower midline scar.  On palpation is soft and nontender without organomegaly or masses.  Musculoskeletal:        General: No edema.  Neurological: She is alert.  Skin: Skin is warm and dry.     Assessment/Plan Heme positive stool hematochezia and iron deficiency. Diagnostic esophagogastroduodenoscopy and colonoscopy.  Lionel December, MD 04/23/2019, 7:19 AM

## 2019-04-23 NOTE — Addendum Note (Signed)
Addendum  created 04/23/19 0859 by Lyda Jester, CRNA   Charge Capture section accepted

## 2019-04-23 NOTE — Discharge Instructions (Signed)
No aspirin or NSAIDs for 24 hours. Resume usual medications as before. Ferrous sulfate 325 mg by mouth daily with breakfast. Resume usual diet. No driving for 24 hours. Physician will call with biopsy results.

## 2019-04-23 NOTE — Op Note (Signed)
[facility] Patient Name: Kathleen Saunders Procedure Date: 04/23/2019 7:11 AM MRN: [id number] Date of Birth: 11-12-1950 Attending MD: Hildred Laser , MD CSN: [id number] Age: 68 Admit Type: Outpatient Procedure:                Colonoscopy Indications:              Hematochezia, Iron deficiency anemia Providers:                Hildred Laser, MD, Janeece Riggers, RN, Aram Candela Referring MD:             Theador Hawthorne. Hawks, FNP Medicines:                Propofol per Anesthesia Complications:            No immediate complications. Estimated Blood Loss:     Estimated blood loss: none. Procedure:                Pre-Anesthesia Assessment:                           - Prior to the procedure, a History and Physical                            was performed, and patient medications and                            allergies were reviewed. The patient's tolerance of                            previous anesthesia was also reviewed. The risks                            and benefits of the procedure and the sedation                            options and risks were discussed with the patient.                            All questions were answered, and informed consent                            was obtained. After reviewing the risks and                            benefits, the patient was deemed in satisfactory                            condition to undergo the procedure.                           - Prior to the procedure, a History and Physical                            was performed, and patient medications and  allergies were reviewed. The patient's tolerance of                            previous anesthesia was also reviewed. The risks                            and benefits of the procedure and the sedation                            options and risks were discussed with the patient.                            All questions were answered, and informed consent                             was obtained. Prior Anticoagulants: The patient has                            taken no previous anticoagulant or antiplatelet                            agents except for NSAID medication. ASA Grade                            Assessment: III - A patient with severe systemic                            disease. After reviewing the risks and benefits,                            the patient was deemed in satisfactory condition to                            undergo the procedure.                           After obtaining informed consent, the colonoscope                            was passed under direct vision. Throughout the                            procedure, the patient's blood pressure, pulse, and                            oxygen saturations were monitored continuously. The                            [id number]-[id number] ([id number]) scope was introduced through                            the anus and advanced to the the cecum, identified  by appendiceal orifice and ileocecal valve. The                            colonoscopy was performed without difficulty. The                            patient tolerated the procedure well. The quality                            of the bowel preparation was good. The ileocecal                            valve, appendiceal orifice, and rectum were                            photographed. Scope In: 7:40:40 AM Scope Out: 7:54:52 AM Scope Withdrawal Time: 0 hours 9 minutes 59 seconds  Total Procedure Duration: 0 hours 14 minutes 12 seconds  Findings:      The perianal and digital rectal examinations were normal.      The colon (entire examined portion) appeared normal.      External hemorrhoids were found during retroflexion. The hemorrhoids       were small. Impression:               - The entire examined colon is normal.                           - External hemorrhoids.                           - No specimens  collected. Moderate Sedation:      Per Anesthesia Care Recommendation:           - Patient has a contact number available for                            emergencies. The signs and symptoms of potential                            delayed complications were discussed with the                            patient. Return to normal activities tomorrow.                            Written discharge instructions were provided to the                            patient.                           - Resume previous diet today.                           - Continue present medications.                           -  Ferrous sulfate 325 mg po qam.                           - Repeat colonoscopy in 10 years for screening                            purposes. Procedure Code(s):        --- Professional ---                           [phone], Colonoscopy, flexible; diagnostic, including                            collection of specimen(s) by brushing or washing,                            when performed (separate procedure) Diagnosis Code(s):        --- Professional ---                           K64.4, Residual hemorrhoidal skin tags                           K92.1, Melena (includes Hematochezia)                           D50.9, Iron deficiency anemia, unspecified CPT copyright 2019 American Medical Association. All rights reserved. The codes documented in this report are preliminary and upon coder review may  be revised to meet current compliance requirements. Lionel DecemberNajeeb Rehman, MD Lionel DecemberNajeeb Rehman, MD 04/23/2019 8:13:05 AM This report has been signed electronically. Number of Addenda: 0

## 2019-04-24 LAB — SURGICAL PATHOLOGY

## 2019-04-26 ENCOUNTER — Encounter (HOSPITAL_COMMUNITY): Payer: Self-pay | Admitting: Internal Medicine

## 2019-04-30 ENCOUNTER — Inpatient Hospital Stay (HOSPITAL_COMMUNITY): Payer: PPO | Attending: Nurse Practitioner | Admitting: Nurse Practitioner

## 2019-04-30 ENCOUNTER — Inpatient Hospital Stay (HOSPITAL_COMMUNITY): Payer: PPO

## 2019-05-08 ENCOUNTER — Other Ambulatory Visit: Payer: Self-pay | Admitting: *Deleted

## 2019-05-08 DIAGNOSIS — F411 Generalized anxiety disorder: Secondary | ICD-10-CM

## 2019-05-08 DIAGNOSIS — M159 Polyosteoarthritis, unspecified: Secondary | ICD-10-CM

## 2019-05-08 MED ORDER — METHOCARBAMOL 500 MG PO TABS: ORAL_TABLET | ORAL | 2 refills | Status: AC

## 2019-05-08 MED ORDER — HYDROXYZINE PAMOATE 50 MG PO CAPS: 50.0000 mg | ORAL_CAPSULE | Freq: Three times a day (TID) | ORAL | 3 refills | Status: AC

## 2019-05-14 ENCOUNTER — Other Ambulatory Visit: Payer: Self-pay

## 2019-05-14 ENCOUNTER — Inpatient Hospital Stay (HOSPITAL_COMMUNITY): Payer: PPO | Attending: Hematology

## 2019-05-14 DIAGNOSIS — D649 Anemia, unspecified: Secondary | ICD-10-CM | POA: Diagnosis not present

## 2019-05-14 LAB — COMPREHENSIVE METABOLIC PANEL
ALT: 16 U/L (ref 0–44)
AST: 14 U/L — ABNORMAL LOW (ref 15–41)
Albumin: 3.6 g/dL (ref 3.5–5.0)
Alkaline Phosphatase: 82 U/L (ref 38–126)
Anion gap: 11 (ref 5–15)
BUN: 34 mg/dL — ABNORMAL HIGH (ref 8–23)
CO2: 23 mmol/L (ref 22–32)
Calcium: 9.1 mg/dL (ref 8.9–10.3)
Chloride: 106 mmol/L (ref 98–111)
Creatinine, Ser: 1.33 mg/dL — ABNORMAL HIGH (ref 0.44–1.00)
GFR calc Af Amer: 48 mL/min — ABNORMAL LOW (ref >60)
GFR calc non Af Amer: 41 mL/min — ABNORMAL LOW (ref >60)
Glucose, Bld: 154 mg/dL — ABNORMAL HIGH (ref 70–99)
Potassium: 4.7 mmol/L (ref 3.5–5.1)
Sodium: 140 mmol/L (ref 135–145)
Total Bilirubin: 0.4 mg/dL (ref 0.3–1.2)
Total Protein: 6.6 g/dL (ref 6.5–8.1)

## 2019-05-14 LAB — IRON AND TIBC
Iron: 98 ug/dL (ref 28–170)
Saturation Ratios: 35 % — ABNORMAL HIGH (ref 10.4–31.8)
TIBC: 282 ug/dL (ref 250–450)
UIBC: 184 ug/dL

## 2019-05-14 LAB — CBC WITH DIFFERENTIAL/PLATELET
Abs Immature Granulocytes: 0.06 10*3/uL (ref 0.00–0.07)
Basophils Absolute: 0.1 10*3/uL (ref 0.0–0.1)
Basophils Relative: 1 %
Eosinophils Absolute: 0.3 10*3/uL (ref 0.0–0.5)
Eosinophils Relative: 3 %
HCT: 38.1 % (ref 36.0–46.0)
Hemoglobin: 11.3 g/dL — ABNORMAL LOW (ref 12.0–15.0)
Immature Granulocytes: 1 %
Lymphocytes Relative: 26 %
Lymphs Abs: 2.6 10*3/uL (ref 0.7–4.0)
MCH: 26.8 pg (ref 26.0–34.0)
MCHC: 29.7 g/dL — ABNORMAL LOW (ref 30.0–36.0)
MCV: 90.3 fL (ref 80.0–100.0)
Monocytes Absolute: 0.6 10*3/uL (ref 0.1–1.0)
Monocytes Relative: 6 %
Neutro Abs: 6.4 10*3/uL (ref 1.7–7.7)
Neutrophils Relative %: 63 %
Platelets: 278 10*3/uL (ref 150–400)
RBC: 4.22 MIL/uL (ref 3.87–5.11)
RDW: 13.8 % (ref 11.5–15.5)
WBC: 10.1 10*3/uL (ref 4.0–10.5)
nRBC: 0 % (ref 0.0–0.2)

## 2019-05-14 LAB — VITAMIN B12: Vitamin B-12: 349 pg/mL (ref 180–914)

## 2019-05-14 LAB — FOLATE: Folate: 22.7 ng/mL (ref >5.9)

## 2019-05-14 LAB — VITAMIN D 25 HYDROXY (VIT D DEFICIENCY, FRACTURES): Vit D, 25-Hydroxy: 26.3 ng/mL — ABNORMAL LOW (ref 30–100)

## 2019-05-14 LAB — LACTATE DEHYDROGENASE: LDH: 114 U/L (ref 98–192)

## 2019-05-14 LAB — FERRITIN: Ferritin: 110 ng/mL (ref 11–307)

## 2019-05-16 ENCOUNTER — Other Ambulatory Visit (HOSPITAL_COMMUNITY): Payer: PPO

## 2019-05-16 ENCOUNTER — Inpatient Hospital Stay (HOSPITAL_BASED_OUTPATIENT_CLINIC_OR_DEPARTMENT_OTHER): Payer: PPO | Admitting: Hematology

## 2019-05-16 ENCOUNTER — Encounter (HOSPITAL_COMMUNITY): Payer: Self-pay | Admitting: Hematology

## 2019-05-16 ENCOUNTER — Other Ambulatory Visit: Payer: Self-pay

## 2019-05-16 DIAGNOSIS — D649 Anemia, unspecified: Secondary | ICD-10-CM

## 2019-05-16 NOTE — Progress Notes (Signed)
Virtual Visit via Telephone Note  I connected with Kathleen Saunders on 05/16/19 at  2:20 PM EST by telephone and verified that I am speaking with the correct person using two identifiers.   I discussed the limitations, risks, security and privacy concerns of performing an evaluation and management service by telephone and the availability of in person appointments. I also discussed with the patient that there may be a patient responsible charge related to this service. The patient expressed understanding and agreed to proceed.   History of Present Illness: She was seen in our clinic for normocytic anemia.  She had received Feraheme infusions in September.  She also had intermittent bleeding per rectum at that time.   Observations/Objective: She reported improvement in her energy levels which lasted for about a month.  Now the energy levels are down again.  She had colonoscopy done on 04/23/2019 which showed normal colon.  Assessment and Plan:  1.  Normocytic anemia: -Etiology is CKD and relative iron deficiency. -Received Feraheme on 02/13/2019 and 02/28/2019. -Hemoglobin improved by 1 g to 11.3.  Ferritin improved to 110.  Percent saturation is 35.  N82 and folic acid were normal.  SPEP was normal.  Copper level was normal. -Patient still complains of severe tiredness.  Improvement in tiredness lasted a month after last iron infusion. -Colonoscopy 04/23/2019 showed normal colon with external hemorrhoids. -Because of her severe tiredness, I have recommended 2 more infusions of Feraheme.  2.  CKD: -Her creatinine is ranging between 1.3-1.4.  I told her to avoid nephrotoxic agents.  We will keep a close eye on it.  3.  Vitamin D levels: -Vitamin D is borderline low at 26.3.  We will repeat it again at next visit.  She will continue over-the-counter calcium and vitamin D.   Follow Up Instructions:   RTC 3 months with repeat labs. I discussed the assessment and treatment plan with the patient.  The patient was provided an opportunity to ask questions and all were answered. The patient agreed with the plan and demonstrated an understanding of the instructions.   The patient was advised to call back or seek an in-person evaluation if the symptoms worsen or if the condition fails to improve as anticipated.  I provided 12 minutes of non-face-to-face time during this encounter.   Derek Jack, MD

## 2019-05-17 ENCOUNTER — Other Ambulatory Visit (HOSPITAL_COMMUNITY): Payer: PPO

## 2019-05-21 ENCOUNTER — Other Ambulatory Visit: Payer: Self-pay

## 2019-05-21 ENCOUNTER — Encounter (HOSPITAL_COMMUNITY): Payer: Self-pay

## 2019-05-21 ENCOUNTER — Inpatient Hospital Stay (HOSPITAL_COMMUNITY): Payer: PPO

## 2019-05-21 VITALS — BP 112/47 | HR 60 | Temp 97.7°F | Resp 18

## 2019-05-21 DIAGNOSIS — D649 Anemia, unspecified: Secondary | ICD-10-CM | POA: Diagnosis not present

## 2019-05-21 MED ORDER — SODIUM CHLORIDE 0.9% FLUSH
10.0000 mL | Freq: Once | INTRAVENOUS | Status: AC
  Administered 2019-05-21: 10 mL via INTRAVENOUS

## 2019-05-21 MED ORDER — SODIUM CHLORIDE 0.9 % IV SOLN
510.0000 mg | Freq: Once | INTRAVENOUS | Status: AC
  Administered 2019-05-21: 510 mg via INTRAVENOUS
  Filled 2019-05-21: qty 510

## 2019-05-21 MED ORDER — SODIUM CHLORIDE 0.9 % IV SOLN
Freq: Once | INTRAVENOUS | Status: AC
  Administered 2019-05-21: 13:00:00 via INTRAVENOUS

## 2019-05-21 NOTE — Progress Notes (Signed)
Patient tolerated iron infusion with no complaints voiced.  Peripheral IV site clean and dry with good blood return noted before and after infusion.  Band aid applied.  VSS with discharge and left by wheelchair with no s/s of distress noted.  

## 2019-05-24 ENCOUNTER — Ambulatory Visit (HOSPITAL_COMMUNITY): Payer: PPO | Admitting: Nurse Practitioner

## 2019-05-28 ENCOUNTER — Other Ambulatory Visit: Payer: Self-pay

## 2019-05-28 ENCOUNTER — Encounter (HOSPITAL_COMMUNITY): Payer: Self-pay

## 2019-05-28 ENCOUNTER — Inpatient Hospital Stay (HOSPITAL_COMMUNITY): Payer: PPO

## 2019-05-28 VITALS — BP 109/50 | HR 61 | Temp 98.2°F | Resp 18

## 2019-05-28 DIAGNOSIS — D649 Anemia, unspecified: Secondary | ICD-10-CM | POA: Diagnosis not present

## 2019-05-28 MED ORDER — SODIUM CHLORIDE 0.9 % IV SOLN
510.0000 mg | Freq: Once | INTRAVENOUS | Status: AC
  Administered 2019-05-28: 510 mg via INTRAVENOUS
  Filled 2019-05-28: qty 510

## 2019-05-28 MED ORDER — SODIUM CHLORIDE 0.9 % IV SOLN: INTRAVENOUS | Status: DC

## 2019-05-28 NOTE — Patient Instructions (Signed)
[facility] at [facility] Discharge Instructions  Received Feraheme infusion today. Follow-up as scheduled. Call clinic for any questions or concerns   Thank you for choosing [facility] to provide your oncology and hematology care.  To afford each patient quality time with our provider, please arrive at least 15 minutes before your scheduled appointment time.   If you have a lab appointment with the Cancer Center please come in thru the Main Entrance and check in at the main information desk.  You need to re-schedule your appointment should you arrive 10 or more minutes late.  We strive to give you quality time with our providers, and arriving late affects you and other patients whose appointments are after yours.  Also, if you no show three or more times for appointments you may be dismissed from the clinic at the providers discretion.     Again, thank you for choosing [facility].  Our hope is that these requests will decrease the amount of time that you wait before being seen by our physicians.       _____________________________________________________________  Should you have questions after your visit to [facility], please contact our office at [phone] between the hours of 8:00 a.m. and 4:30 p.m.  Voicemails left after 4:00 p.m. will not be returned until the following business day.  For prescription refill requests, have your pharmacy contact our office and allow 72 hours.    Due to Covid, you will need to wear a mask upon entering the hospital. If you do not have a mask, a mask will be given to you at the Main Entrance upon arrival. For doctor visits, patients may have 1 support person with them. For treatment visits, patients can not have anyone with them due to social distancing guidelines and our immunocompromised population.     

## 2019-05-28 NOTE — Progress Notes (Signed)
Kathleen Saunders tolerated Feraheme infusion well without complaints or incident. Peripheral IV site checked with positive blood return noted prior to and after infusion. VSS Pt discharged via wheelchair in satisfactory condition accompanied by her husband

## 2019-06-11 ENCOUNTER — Other Ambulatory Visit: Payer: Self-pay | Admitting: Family

## 2019-06-11 DIAGNOSIS — F411 Generalized anxiety disorder: Secondary | ICD-10-CM

## 2019-06-11 DIAGNOSIS — F331 Major depressive disorder, recurrent, moderate: Secondary | ICD-10-CM

## 2019-07-06 ENCOUNTER — Telehealth: Payer: Self-pay | Admitting: Family

## 2019-07-06 NOTE — Telephone Encounter (Signed)
Appt given for MOnday at 11:55am.

## 2019-07-09 ENCOUNTER — Encounter: Payer: Self-pay | Admitting: Family

## 2019-07-09 ENCOUNTER — Other Ambulatory Visit: Payer: Self-pay

## 2019-07-09 ENCOUNTER — Ambulatory Visit (INDEPENDENT_AMBULATORY_CARE_PROVIDER_SITE_OTHER): Payer: PPO | Admitting: Family

## 2019-07-09 DIAGNOSIS — K219 Gastro-esophageal reflux disease without esophagitis: Secondary | ICD-10-CM

## 2019-07-09 DIAGNOSIS — M150 Primary generalized (osteo)arthritis: Secondary | ICD-10-CM

## 2019-07-09 DIAGNOSIS — E782 Mixed hyperlipidemia: Secondary | ICD-10-CM

## 2019-07-09 DIAGNOSIS — I10 Essential (primary) hypertension: Secondary | ICD-10-CM

## 2019-07-09 DIAGNOSIS — G8929 Other chronic pain: Secondary | ICD-10-CM

## 2019-07-09 DIAGNOSIS — G4733 Obstructive sleep apnea (adult) (pediatric): Secondary | ICD-10-CM

## 2019-07-09 DIAGNOSIS — M159 Polyosteoarthritis, unspecified: Secondary | ICD-10-CM

## 2019-07-09 DIAGNOSIS — F331 Major depressive disorder, recurrent, moderate: Secondary | ICD-10-CM

## 2019-07-09 DIAGNOSIS — F1120 Opioid dependence, uncomplicated: Secondary | ICD-10-CM | POA: Diagnosis not present

## 2019-07-09 DIAGNOSIS — F411 Generalized anxiety disorder: Secondary | ICD-10-CM

## 2019-07-09 DIAGNOSIS — Z9989 Dependence on other enabling machines and devices: Secondary | ICD-10-CM

## 2019-07-09 DIAGNOSIS — M5441 Lumbago with sciatica, right side: Secondary | ICD-10-CM | POA: Diagnosis not present

## 2019-07-09 DIAGNOSIS — J4540 Moderate persistent asthma, uncomplicated: Secondary | ICD-10-CM

## 2019-07-09 DIAGNOSIS — Z0289 Encounter for other administrative examinations: Secondary | ICD-10-CM | POA: Diagnosis not present

## 2019-07-09 DIAGNOSIS — M8949 Other hypertrophic osteoarthropathy, multiple sites: Secondary | ICD-10-CM | POA: Diagnosis not present

## 2019-07-09 DIAGNOSIS — Z6841 Body Mass Index (BMI) 40.0 and over, adult: Secondary | ICD-10-CM

## 2019-07-09 MED ORDER — OXYCODONE-ACETAMINOPHEN 7.5-325 MG PO TABS: 1.0000 | ORAL_TABLET | ORAL | 0 refills | Status: AC | PRN

## 2019-07-09 MED ORDER — OXYCODONE-ACETAMINOPHEN 7.5-325 MG PO TABS: 1.0000 | ORAL_TABLET | Freq: Four times a day (QID) | ORAL | 0 refills | Status: AC | PRN

## 2019-07-09 NOTE — Progress Notes (Signed)
Virtual Visit via telephone Note Due to COVID-19 pandemic this visit was conducted virtually. This visit type was conducted due to national recommendations for restrictions regarding the COVID-19 Pandemic (e.g. social distancing, sheltering in place) in an effort to limit this patient's exposure and mitigate transmission in our community. All issues noted in this document were discussed and addressed.  A physical exam was not performed with this format.  I connected with Kathleen Saunders on 07/09/19 at 12:38 pm  by telephone and verified that I am speaking with the correct person using two identifiers. Kathleen Saunders is currently located at driving and no one is currently with her during visit. The provider, Jannifer Rodney, FNP is located in their office at time of visit.  I discussed the limitations, risks, security and privacy concerns of performing an evaluation and management service by telephone and the availability of in person appointments. I also discussed with the patient that there may be a patient responsible charge related to this service. The patient expressed understanding and agreed to proceed.   History and Present Illness:  PT presents to the office today for chronic follow up. She also see's Ortho for bilateral knee pain and back pain. Followed by Hematologists for iron deficiency anemia and getting iron infusions. Hypertension This is a chronic problem. The current episode started more than 1 year ago. The problem has been resolved since onset. The problem is controlled. Associated symptoms include anxiety and malaise/fatigue. Pertinent negatives include no shortness of breath. Risk factors for coronary artery disease include obesity, dyslipidemia and sedentary lifestyle. The current treatment provides moderate improvement. There is no history of kidney disease, CVA or heart failure. Identifiable causes of hypertension include sleep apnea.  Asthma There is no cough, shortness of  breath or wheezing. This is a chronic problem. The current episode started more than 1 year ago. The problem occurs intermittently. The problem has been waxing and waning. Associated symptoms include heartburn and malaise/fatigue. Her symptoms are alleviated by rest. She reports significant improvement on treatment. Her symptoms are not alleviated by rest. Her past medical history is significant for asthma.  Gastroesophageal Reflux She complains of belching and heartburn. She reports no coughing or no wheezing. This is a chronic problem. The current episode started more than 1 year ago. The problem occurs occasionally. The problem has been waxing and waning. Risk factors include obesity. She has tried a PPI for the symptoms. The treatment provided moderate relief.  Arthritis Presents for follow-up visit. She complains of pain and stiffness. The symptoms have been stable. Affected locations include the right knee and left knee (back). Her pain is at a severity of 10/10.  Anemia Presents for follow-up visit. Symptoms include malaise/fatigue. There has been no bruising/bleeding easily. There is no history of heart failure.  Hyperlipidemia This is a chronic problem. The current episode started more than 1 year ago. The problem is uncontrolled. Recent lipid tests were reviewed and are high. Exacerbating diseases include obesity. Pertinent negatives include no shortness of breath. Current antihyperlipidemic treatment includes statins. The current treatment provides moderate improvement of lipids. Risk factors for coronary artery disease include dyslipidemia, female sex, hypertension, a sedentary lifestyle and post-menopausal.  Anxiety Presents for follow-up visit. Symptoms include decreased concentration, depressed mood, excessive worry, irritability, nervous/anxious behavior and restlessness. Patient reports no shortness of breath. Symptoms occur most days. The severity of symptoms is moderate.   Her past  medical history is significant for anemia and asthma.  Depression  This is a chronic problem.  The current episode started more than 1 year ago.   The onset quality is gradual.   The problem occurs intermittently.  Associated symptoms include decreased concentration, irritable, restlessness and sad.  Associated symptoms include no helplessness and no hopelessness.  Past treatments include SSRIs - Selective serotonin reuptake inhibitors.  Compliance with treatment is good.  Previous treatment provided moderate relief.  Past medical history includes anxiety.   Back Pain This is a chronic problem. The problem occurs intermittently. The pain is present in the lumbar spine. The quality of the pain is described as aching. The pain is at a severity of 9/10. She has tried bed rest for the symptoms. The treatment provided moderate relief.      Review of Systems  Constitutional: Positive for irritability and malaise/fatigue.  Respiratory: Negative for cough, shortness of breath and wheezing.   Gastrointestinal: Positive for heartburn.  Musculoskeletal: Positive for arthritis, back pain and stiffness.  Endo/Heme/Allergies: Does not bruise/bleed easily.  Psychiatric/Behavioral: Positive for decreased concentration and depression. The patient is nervous/anxious.   All other systems reviewed and are negative.    Observations/Objective: No SOB or distress noted   Assessment and Plan: Kathleen Saunders comes in today with chief complaint of No chief complaint on file.   Diagnosis and orders addressed:  1. Essential hypertension  2. Moderate persistent asthma without complication  3. OSA on CPAP  4. Gastroesophageal reflux disease, unspecified whether esophagitis present  5. Primary osteoarthritis involving multiple joints - oxyCODONE-acetaminophen (PERCOCET) 7.5-325 MG tablet; Take 1 tablet by mouth every 6 (six) hours as needed for severe pain.  Dispense: 120 tablet; Refill: 0 -  oxyCODONE-acetaminophen (PERCOCET) 7.5-325 MG tablet; Take 1 tablet by mouth every 4 (four) hours as needed for severe pain.  Dispense: 120 tablet; Refill: 0 - oxyCODONE-acetaminophen (PERCOCET) 7.5-325 MG tablet; Take 1 tablet by mouth every 4 (four) hours as needed for severe pain.  Dispense: 120 tablet; Refill: 0  6. Chronic bilateral low back pain with right-sided sciatica - oxyCODONE-acetaminophen (PERCOCET) 7.5-325 MG tablet; Take 1 tablet by mouth every 6 (six) hours as needed for severe pain.  Dispense: 120 tablet; Refill: 0 - oxyCODONE-acetaminophen (PERCOCET) 7.5-325 MG tablet; Take 1 tablet by mouth every 4 (four) hours as needed for severe pain.  Dispense: 120 tablet; Refill: 0 - oxyCODONE-acetaminophen (PERCOCET) 7.5-325 MG tablet; Take 1 tablet by mouth every 4 (four) hours as needed for severe pain.  Dispense: 120 tablet; Refill: 0  7. Moderate episode of recurrent major depressive disorder ([place])   8. GAD (generalized anxiety disorder)  9. Mixed hyperlipidemia  10. Morbid obesity with BMI of 50.0-59.9, adult ([place])  11. Uncomplicated opioid dependence ([place]) - oxyCODONE-acetaminophen (PERCOCET) 7.5-325 MG tablet; Take 1 tablet by mouth every 6 (six) hours as needed for severe pain.  Dispense: 120 tablet; Refill: 0 - oxyCODONE-acetaminophen (PERCOCET) 7.5-325 MG tablet; Take 1 tablet by mouth every 4 (four) hours as needed for severe pain.  Dispense: 120 tablet; Refill: 0 - oxyCODONE-acetaminophen (PERCOCET) 7.5-325 MG tablet; Take 1 tablet by mouth every 4 (four) hours as needed for severe pain.  Dispense: 120 tablet; Refill: 0  12. Pain medication agreement signed - oxyCODONE-acetaminophen (PERCOCET) 7.5-325 MG tablet; Take 1 tablet by mouth every 6 (six) hours as needed for severe pain.  Dispense: 120 tablet; Refill: 0 - oxyCODONE-acetaminophen (PERCOCET) 7.5-325 MG tablet; Take 1 tablet by mouth every 4 (four) hours as needed for severe pain.  Dispense: 120 tablet; Refill:  0 - oxyCODONE-acetaminophen (PERCOCET) 7.5-325 MG tablet; Take 1 tablet by mouth every 4 (four) hours as needed for severe pain.  Dispense: 120 tablet; Refill: 0   Labs pending PT reviewed in [place] controlled database- No red flags noted Contract and drug screen UTD, will be seen face to face to update on next visit  Health Maintenance reviewed Diet and exercise encouraged  Follow up plan: 3 months      I discussed the assessment and treatment plan with the patient. The patient was provided an opportunity to ask questions and all were answered. The patient agreed with the plan and demonstrated an understanding of the instructions.   The patient was advised to call back or seek an in-person evaluation if the symptoms worsen or if the condition fails to improve as anticipated.  The above assessment and management plan was discussed with the patient. The patient verbalized understanding of and has agreed to the management plan. Patient is aware to call the clinic if symptoms persist or worsen. Patient is aware when to return to the clinic for a follow-up visit. Patient educated on when it is appropriate to go to the emergency department.   Time call ended:  1:03 pm   I provided 25 minutes of non-face-to-face time during this encounter.    Jannifer Rodney, FNP

## 2019-07-17 ENCOUNTER — Telehealth: Payer: Self-pay | Admitting: Family

## 2019-07-17 NOTE — Chronic Care Management (AMB) (Signed)
  Chronic Care Management   Note  07/17/2019 Name: Kathleen Saunders MRN: [id number] DOB: [birth year]  Kathleen Saunders is a 69 y.o. year old female who is a primary care patient of Sharion Balloon, FNP. I reached out to Matilde Bash by phone today in response to a referral sent by Ms. Osker Mason Fittro's health plan.     Ms. Knoles was given information about Chronic Care Management services today including:  1. CCM service includes personalized support from designated clinical staff supervised by her physician, including individualized plan of care and coordination with other care providers 2. 24/7 contact phone numbers for assistance for urgent and routine care needs. 3. Service will only be billed when office clinical staff spend 20 minutes or more in a month to coordinate care. 4. Only one practitioner may furnish and bill the service in a calendar month. 5. The patient may stop CCM services at any time (effective at the end of the month) by phone call to the office staff. 6. The patient will be responsible for cost sharing (co-pay) of up to 20% of the service fee (after annual deductible is met).  Patient agreed to services and verbal consent obtained.   Follow up plan: Telephone appointment with care management team member scheduled for: 07/30/2019  Noreene Larsson, [place], [place], [place] [address] Direct Dial: [phone] [email or web address] Website: [email or web address]

## 2019-07-27 ENCOUNTER — Other Ambulatory Visit: Payer: Self-pay | Admitting: Family

## 2019-07-27 DIAGNOSIS — I10 Essential (primary) hypertension: Secondary | ICD-10-CM

## 2019-07-30 ENCOUNTER — Ambulatory Visit (INDEPENDENT_AMBULATORY_CARE_PROVIDER_SITE_OTHER): Payer: PPO | Admitting: *Deleted

## 2019-07-30 DIAGNOSIS — M159 Polyosteoarthritis, unspecified: Secondary | ICD-10-CM

## 2019-07-30 DIAGNOSIS — J4540 Moderate persistent asthma, uncomplicated: Secondary | ICD-10-CM

## 2019-07-30 DIAGNOSIS — I10 Essential (primary) hypertension: Secondary | ICD-10-CM

## 2019-07-30 NOTE — Patient Instructions (Signed)
Visit Information  Goals Addressed            This Visit's Progress   . Chronic Disease Management Needs       CARE PLAN ENTRY (see longtitudinal plan of care for additional care plan information)  Current Barriers:  . Chronic Disease Management support, education, and care coordination needs related to HTN, Asthma, OSA, OA, hyperlipidemia, anxiety, depression, chronic pain, anemia  Clinical Goal(s) related to HTN, Asthma, OSA, OA, hyperlipidemia, anxiety, depression, chronic pain, anemia:  Over the next 60 days, patient will:  . Work with the care management team to address educational, disease management, and care coordination needs  . Call provider office for new or worsened signs and symptoms.  o Contact PCP if hip/knee pain from fall do not improve or if they worsen.  o Also contact PCP if you're having to use albuterol for a couple of times a week. A maintenance inhaler may be necessarynew or worsened signs and symptoms  . Call care management team with questions or concerns . Verbalize basic understanding of patient centered plan of care established today  Interventions related to HTN, Asthma, OSA, OA, hyperlipidemia, anxiety, depression, chronic pain, anemia:  . Evaluation of current treatment plans and patient's adherence to plan as established by provider . Assessed patient understanding of disease states . Assessed patient's education and care coordination needs . Provided disease specific education to patient  . Collaborated with appropriate clinical care team members regarding patient needs  Patient Self Care Activities related to HTN, Asthma, OSA, OA, hyperlipidemia, anxiety, depression, chronic pain, anemia:  . Patient is unable to independently self-manage chronic health conditions  Initial goal documentation     . Fall Prevention       Current Barriers:  Marland Kitchen Knowledge Deficits related to fall precautions . Decreased adherence to prescribed treatment for fall  prevention  Nurse Case Manager Clinical Goal(s):  Marland Kitchen Over the next 30 days, patient will demonstrate improved adherence to prescribed treatment plan for decreasing falls as evidenced by patient reporting and review of EMR . Over the next 30 days, patient will verbalize using fall risk reduction strategies discussed . Over the next 90 days, patient will not experience additional falls  Interventions:  . Provided verbal education re: Potential causes of falls and Fall prevention strategies . Reviewed medications and discussed potential side effects of medications such as dizziness and frequent urination . Assessed for s/s of orthostatic hypotension . Assessed for falls since last encounter. . Assessed patients knowledge of fall risk prevention secondary to previously provided education.  Patient Self Care Activities:  . De-clutter walkways . Change positions slowly . Wear secure fitting shoes at all times with ambulation . Utilize home lighting for dim lit areas . Have self and pet awareness at all times  Plan: . CCM RN CM will follow up in 45   Initial goal documentation     . Stress/Caregiver Strain       CARE PLAN ENTRY (see longtitudinal plan of care for additional care plan information)  Current Barriers:  Marland Kitchen Knowledge Deficits related to stress and caregiver strain mangement  Nurse Case Manager Clinical Goal(s):  Marland Kitchen Over the next 30 days, patient will work with LCSW to address needs related to stress/caregiver strain.   Interventions:  . Evaluation of current treatment plan related to stress/anxiety/depression and patient's adherence to plan as established by provider. . Reviewed medications  . Discussed plans with patient for ongoing care management follow up and provided patient  with direct contact information for care management team . Referral to embedded LCSW to discuss stress, caregiver strain, anxiety, depression  Patient Self Care Activities:  . Performs ADL's  independently . Performs IADL's independently  Initial goal documentation       The care management team will reach out to the patient again over the next 45 days.    Kathleen Saunders was given information about Chronic Care Management services today including:  1. CCM service includes personalized support from designated clinical staff supervised by her physician, including individualized plan of care and coordination with other care providers 2. 24/7 contact phone numbers for assistance for urgent and routine care needs. 3. Service will only be billed when office clinical staff spend 20 minutes or more in a month to coordinate care. 4. Only one practitioner may furnish and bill the service in a calendar month. 5. The patient may stop CCM services at any time (effective at the end of the month) by phone call to the office staff. 6. The patient will be responsible for cost sharing (co-pay) of up to 20% of the service fee (after annual deductible is met).  Patient agreed to services and verbal consent obtained.   Chong Sicilian, BSN, RN-BC Embedded Chronic Care Manager [facility] [place] [facility] Direct Dial: [phone]   The patient verbalized understanding of instructions provided today and declined a print copy of patient instruction materials.    Fall Prevention in the Home, Adult Falls can cause injuries. They can happen to people of all ages. There are many things you can do to make your home safe and to help prevent falls. Ask for help when making these changes, if needed. What actions can I take to prevent falls? General Instructions  Use good lighting in all rooms. Replace any light bulbs that burn out.  Turn on the lights when you go into a dark area. Use night-lights.  Keep items that you use often in easy-to-reach places. Lower the shelves around your home if necessary.  Set up your furniture so you have a clear path. Avoid moving your  furniture around.  Do not have throw rugs and other things on the floor that can make you trip.  Avoid walking on wet floors.  If any of your floors are uneven, fix them.  Add color or contrast paint or tape to clearly mark and help you see: ? Any grab bars or handrails. ? First and last steps of stairways. ? Where the edge of each step is.  If you use a stepladder: ? Make sure that it is fully opened. Do not climb a closed stepladder. ? Make sure that both sides of the stepladder are locked into place. ? Ask someone to hold the stepladder for you while you use it.  If there are any pets around you, be aware of where they are. What can I do in the bathroom?      Keep the floor dry. Clean up any water that spills onto the floor as soon as it happens.  Remove soap buildup in the tub or shower regularly.  Use non-skid mats or decals on the floor of the tub or shower.  Attach bath mats securely with double-sided, non-slip rug tape.  If you need to sit down in the shower, use a plastic, non-slip stool.  Install grab bars by the toilet and in the tub and shower. Do not use towel bars as grab bars. What can I do in  the bedroom?  Make sure that you have a light by your bed that is easy to reach.  Do not use any sheets or blankets that are too big for your bed. They should not hang down onto the floor.  Have a firm chair that has side arms. You can use this for support while you get dressed. What can I do in the kitchen?  Clean up any spills right away.  If you need to reach something above you, use a strong step stool that has a grab bar.  Keep electrical cords out of the way.  Do not use floor polish or wax that makes floors slippery. If you must use wax, use non-skid floor wax. What can I do with my stairs?  Do not leave any items on the stairs.  Make sure that you have a light switch at the top of the stairs and the bottom of the stairs. If you do not have them, ask  someone to add them for you.  Make sure that there are handrails on both sides of the stairs, and use them. Fix handrails that are broken or loose. Make sure that handrails are as long as the stairways.  Install non-slip stair treads on all stairs in your home.  Avoid having throw rugs at the top or bottom of the stairs. If you do have throw rugs, attach them to the floor with carpet tape.  Choose a carpet that does not hide the edge of the steps on the stairway.  Check any carpeting to make sure that it is firmly attached to the stairs. Fix any carpet that is loose or worn. What can I do on the outside of my home?  Use bright outdoor lighting.  Regularly fix the edges of walkways and driveways and fix any cracks.  Remove anything that might make you trip as you walk through a door, such as a raised step or threshold.  Trim any bushes or trees on the path to your home.  Regularly check to see if handrails are loose or broken. Make sure that both sides of any steps have handrails.  Install guardrails along the edges of any raised decks and porches.  Clear walking paths of anything that might make someone trip, such as tools or rocks.  Have any leaves, snow, or ice cleared regularly.  Use sand or salt on walking paths during winter.  Clean up any spills in your garage right away. This includes grease or oil spills. What other actions can I take?  Wear shoes that: ? Have a low heel. Do not wear high heels. ? Have rubber bottoms. ? Are comfortable and fit you well. ? Are closed at the toe. Do not wear open-toe sandals.  Use tools that help you move around (mobility aids) if they are needed. These include: ? Canes. ? Walkers. ? Scooters. ? Crutches.  Review your medicines with your doctor. Some medicines can make you feel dizzy. This can increase your chance of falling. Ask your doctor what other things you can do to help prevent falls. Where to find more  information  Centers for Disease Control and Prevention, [employer]AD[employer]: https://garcia.biz/  [employer] on Aging: BrainJudge.co.uk Contact a doctor if:  You are afraid of falling at home.  You feel weak, drowsy, or dizzy at home.  You fall at home. Summary  There are many simple things that you can do to make your home safe and to help prevent falls.  Ways to make  your home safe include removing tripping hazards and installing grab bars in the bathroom.  Ask for help when making these changes in your home. This information is not intended to replace advice given to you by your health care provider. Make sure you discuss any questions you have with your health care provider. Document Revised: 09/14/2018 Document Reviewed: 01/06/2017 Elsevier Patient Education  [place].  8 Easy Exercises You Can Do Sitting Down  Got a chair? Then you're ready for this sit-down, total-body workout!.  Safety precaution: Pay attention to your body during the movements -- if anything hurts or causes pain, stop immediately. And check with your doctor first before beginning this, or any, exercise program.   Sunshine Arm Circles Seated in a chair with good posture, hold a ball in both hands with arms extended above your head and/or in front of you, keeping elbows slightly bent. Visualizing the face of a clock out in front of you, begin by holding arms up overhead at 12 o'clock. Circle the ball around to go all the way around the clock in a controlled, fluid motion. When you've reached 12 o'clock again, reverse directions and circle the opposite way. Keep alternating circle directions for 8 repetitions. Rest. Do another set of 8 repetitions.  Modification: A ball is not required for this exercise. Imagine that you are holding a ball while performing the motion. If it is difficult to bring your arms overhead, extend them out in front of you and move arms as if drawing a circle  on the wall with or without the ball.   Tummy Twists Seated in a chair with good posture, hold a ball with both hands close to the body, with elbows bent and pulled in close to the ribcage. Slowly rotate your torso to the right as far as you comfortably can, being sure to keep the rest of your body still and stable. Rotate back to the center and repeat in the opposite direction. Do this 8 times, with two twists counting as a full set. Rest. Do another 8 sets (two twists each). Modification: A ball is not required for this exercise. Imagine you are holding a ball while performing the motion, or hold a small object such as a can of soup or water bottle to add resistance   Ball Chest Press Seated in a chair with good posture, hold a ball with both hands at chest level, palms facing toward each other and elbows bent. Avoid bending forward by keeping your shoulders back at all times. Squeeze the ball slightly as you push the ball away from you in a fluid motion, taking about 2 seconds to extend the arms. Squeeze your shoulder blades together as you pull the ball back toward your chest. Repeat the push and pull motion 10 to 15 times. Rest. Do another set of 10 to 15 repetitions.  Modification: For a greater challenge, add a Tai Chi feel by standing with one leg slightly in front of the other (with a chair nearby if needed for extra balance) and slowly rocking the entire body forward and back as you push the ball away and pull back in.     Front Arm Raises In a seated position with good posture, hold a ball in both hands with palms facing each other. Extend the arms out in front of your body, keeping your elbows slightly bent. Starting with the ball lowered toward the knees, slowly raise your arms to lift the ball up to shoulder  level (no higher), then lower the ball back to the starting position, taking about 2 to 3 seconds to lift and lower. Repeat 10 to 15 times. Rest. Do another set  of 10 to 15 repetitions. Modification: A ball is not required for this exercise. Imagine you are holding a ball as you perform the motion, or hold a small object, such as a can of soup or water bottle for added resistance.        Inner Thigh Squeezes Sitting toward the edge of a chair with good posture and knees bent, place a ball in between your knees; press the knees together to squeeze the ball, taking about 1 to 2 seconds to squeeze. You should feel the resistance in your inner thighs. Slowly release, keeping slight tension on the ball so that it does not fall. Repeat 8 to 10 times. Rest. Do another set of 8 to 10 repetitions. Modification: For a greater challenge, change the count of the squeezes by squeezing the ball and holding for 5 seconds, then releasing again. Or, do short, quick pulsing squeezes.     Knee Extensions Sitting toward the edge of a chair with good posture and bent knees, hold on to the sides of the chair with your hands. Extend the right knee out so that the toes come up toward the ceiling, being sure to keep the knee slightly bent without locking it through the entire movement. Lower the leg back to a bent position and repeat this movement 8 to 10 times, using about 2 seconds each to lift and lower the leg. Switch to the opposite leg and perform 8 to 10 repetitions. Rest briefly. Do another set of 8 to 10 repetitions for each leg. Modification: If you are more advanced, sitting in the same position as above, extend one leg out in front of you with toes pointed to the ceiling. Lift and lower the entire leg only as high as you comfortably can, keeping the knee slightly bent. The longer lever adds difficulty to the exercise.   Elbow to Knee Seated toward the edge of a chair with good posture and knees bent, start with your right arm extended up overhead. Slowly lift the left knee up as you lower your right elbow down toward your left knee, taking about 2  seconds to lower down. Try not to bend over at the waist. Release and go back to the starting position. Repeat 8 to 10 times. Switch sides and do 8 to 10 repetitions, pulling one elbow to the opposite knee. Rest. Do another set of 8 to 10 repetitions on each side. Modification: Try this (with a chair nearby for balance) exercise in a standing position for an increased range of motion.     Overhead Arm Extensions Seated in a chair with good posture, hold a ball with both hands and raise it up over your head, with arms extended without locking the elbows. Keeping the elbows pulled in toward the head, slowly bend the elbows to lower the ball down along the back of the neck, using about 2 seconds to go down, then 2 seconds to push the ball back up over your head. Repeat 8 to 10 times. Rest. Do another set of 8 to 10 repetitions. Modification: Try seated tricep extensions (ball not required for this modification). Bending slightly forward with elbows tucked into your sides, slowly extend the elbows so that your forearms go back behind you, keeping the elbows pulled up and in for the entire  movement. Return to the starting position and repeat. Hold soup cans or small weights for added resistance.

## 2019-07-30 NOTE — Chronic Care Management (AMB) (Addendum)
Chronic Care Management   Initial Visit Note  07/30/2019 Name: Kathleen Saunders MRN: [id number] DOB: 05/11/1951  Referred by: Kathleen Spencer, FNP Reason for referral : Chronic Care Management (RN initial visit)   Kathleen Saunders is a 69 y.o. year old female who is a primary care patient of Kathleen Spencer, FNP. The CCM team was consulted for assistance with chronic disease management and care coordination needs related to HTN, Asthma, OSA, OA, hyperlipidemia, anxiety, depression, chronic pain, anemia.  Review of patient status, including review of consultants reports, relevant laboratory and other test results, and collaboration with appropriate care team members and the patient's provider was performed as part of comprehensive patient evaluation and provision of chronic care management services.    Subjective: I talked with Kathleen Saunders by telephone today regarding her chronic care management needs.    SDOH (Social Determinants of Health) assessments performed: Yes    Stress: Stress Concern Present   Feeling of Stress : Rather much   SDOH Interventions      Most Recent Value  SDOH Interventions  SDOH Interventions for the Following Domains  Stress, Physical Activity  Physical Activity Interventions  Other (Comments) [Handouts on seated exercises]  Stress Interventions  Other (Comment) [Recommended she talk with embedded LCSW regarding caregiver strain and stress]       Objective: Outpatient Encounter Medications as of 07/30/2019  Medication Sig Note   albuterol (PROVENTIL) (2.5 MG/3ML) 0.083% nebulizer solution Take 3 mLs (2.5 mg total) by nebulization every 6 (six) hours as needed for wheezing or shortness of breath.    albuterol (VENTOLIN HFA) 108 (90 Base) MCG/ACT inhaler Inhale 1-2 puffs into the lungs every 6 (six) hours as needed for wheezing or shortness of breath.    colchicine 0.6 MG tablet 1.2 mg then one hour later take 0.6 mg if still having pain. Max 1.8 mg/day    ergocalciferol (VITAMIN D2) 1.25 MG (50000 UT) capsule ergocalciferol (vitamin D2) 1,250 mcg (50,000 unit) capsule  TK 1 C PO Q 7 DAYS    esomeprazole (NEXIUM) 20 MG capsule esomeprazole magnesium 20 mg capsule,delayed release    furosemide (LASIX) 20 MG tablet TAKE 1 TABLET(20 MG) BY MOUTH DAILY    guaiFENesin (MUCINEX) 600 MG 12 hr tablet Take 600 mg by mouth 2 (two) times daily as needed (congestion.).    hydrOXYzine (VISTARIL) 50 MG capsule Take 1 capsule (50 mg total) by mouth 3 (three) times daily.    lisinopril-hydrochlorothiazide (ZESTORETIC) 20-25 MG tablet TAKE 1 TABLET BY MOUTH DAILY    methocarbamol (ROBAXIN) 500 MG tablet TAKE 1 TABLET(500 MG) BY MOUTH EVERY 6 HOURS AS NEEDED FOR MUSCLE SPASMS    omeprazole (PRILOSEC) 40 MG capsule Take 1 capsule (40 mg total) by mouth daily. (Patient taking differently: Take 40 mg by mouth daily before supper. )    oxyCODONE-acetaminophen (PERCOCET) 7.5-325 MG tablet Take 1 tablet by mouth every 6 (six) hours as needed for severe pain.    oxyCODONE-acetaminophen (PERCOCET) 7.5-325 MG tablet Take 1 tablet by mouth every 4 (four) hours as needed for severe pain.    oxyCODONE-acetaminophen (PERCOCET) 7.5-325 MG tablet Take 1 tablet by mouth every 4 (four) hours as needed for severe pain.    sertraline (ZOLOFT) 50 MG tablet TAKE 1 TABLET(50 MG) BY MOUTH DAILY 03/21/2019: On hold per patient awaiting MD to authorize refills   No facility-administered encounter medications on file as of 07/30/2019.     BP Readings from Last 3 Encounters:  05/28/19 Marland Kitchen)  109/50  05/21/19 (!) 112/47  04/23/19 (!) 144/63   Fall Risk  07/30/2019  Falls in the past year? 1  Comment Fell yesterday going up the one step in her house. She was wearing pantyhose and slipped on the vinyl floor.  Number falls in past yr: 0  Injury with Fall? 0  Comment left knee and hip are sore  Risk Factor Category  -  Risk for fall due to : History of fall(s);Impaired mobility;Medication side  effect  Follow up Falls prevention discussed    RN Assessment & Care Plan            This Visit's Progress    Chronic Disease Management Needs       CARE PLAN ENTRY (see longtitudinal plan of care for additional care plan information)  Current Barriers:  Chronic Disease Management support, education, and care coordination needs related to HTN, Asthma, OSA, OA, hyperlipidemia, anxiety, depression, chronic pain, anemia  Clinical Goal(s) related to HTN, Asthma, OSA, OA, hyperlipidemia, anxiety, depression, chronic pain, anemia:  Over the next 60 days, patient will:  Work with the care management team to address educational, disease management, and care coordination needs  Call provider office for new or worsened signs and symptoms. Contact PCP if hip/knee pain from fall do not improve or if they worsen. Also contact PCP if you're having to use albuterol for a couple of times a week. A maintenance inhaler may be necessary.  Call care management team with questions or concerns Verbalize basic understanding of patient centered plan of care established today  Interventions related to HTN, Asthma, OSA, OA, hyperlipidemia, anxiety, depression, chronic pain, anemia:  Evaluation of current treatment plans and patient's adherence to plan as established by provider Assessed patient understanding of disease states Assessed patient's education and care coordination needs Provided disease specific education to patient  Collaborated with appropriate clinical care team members regarding patient needs  Patient Self Care Activities related to HTN, Asthma, OSA, OA, hyperlipidemia, anxiety, depression, chronic pain, anemia:  Patient is unable to independently self-manage chronic health conditions  Initial goal documentation      Fall Prevention       Current Barriers:  Knowledge Deficits related to fall precautions Decreased adherence to prescribed treatment for fall prevention  Nurse Case  Manager Clinical Goal(s):  Over the next 30 days, patient will demonstrate improved adherence to prescribed treatment plan for decreasing falls as evidenced by patient reporting and review of EMR Over the next 30 days, patient will verbalize using fall risk reduction strategies discussed Over the next 90 days, patient will not experience additional falls  Interventions:  Provided verbal education re: Potential causes of falls and Fall prevention strategies Reviewed medications and discussed potential side effects of medications such as dizziness and frequent urination Assessed for s/s of orthostatic hypotension Assessed for falls since last encounter. Assessed patients knowledge of fall risk prevention secondary to previously provided education.  Patient Self Care Activities:  De-clutter walkways Change positions slowly Wear secure fitting shoes at all times with ambulation Utilize home lighting for dim lit areas Have self and pet awareness at all times  Plan: CCM RN CM will follow up in 45   Initial goal documentation      Stress/Caregiver Strain       CARE PLAN ENTRY (see longtitudinal plan of care for additional care plan information)  Current Barriers:  Knowledge Deficits related to stress and caregiver strain mangement  Nurse Case Manager Clinical Goal(s):  Over the  next 30 days, patient will work with LCSW to address needs related to stress/caregiver strain.   Interventions:  Evaluation of current treatment plan related to stress/anxiety/depression and patient's adherence to plan as established by provider. Reviewed medications  Discussed plans with patient for ongoing care management follow up and provided patient with direct contact information for care management team Referral to embedded LCSW to discuss stress, caregiver strain, anxiety, depression  Patient Self Care Activities:  Performs ADL's independently Performs IADL's independently  Initial goal  documentation         Follow-up Plan:  A member of the CCM team will reach out to the patient again over the next 45 days.   Chong Sicilian, BSN, RN-BC Embedded Chronic Care Manager [facility] Direct Dial: [phone]    I have reviewed and agree with the above  documentation.   Evelina Dun, FNP

## 2019-08-03 ENCOUNTER — Ambulatory Visit: Payer: Self-pay | Admitting: Licensed Clinical Social Worker

## 2019-08-03 DIAGNOSIS — F331 Major depressive disorder, recurrent, moderate: Secondary | ICD-10-CM | POA: Diagnosis not present

## 2019-08-03 DIAGNOSIS — E782 Mixed hyperlipidemia: Secondary | ICD-10-CM

## 2019-08-03 DIAGNOSIS — J4540 Moderate persistent asthma, uncomplicated: Secondary | ICD-10-CM

## 2019-08-03 DIAGNOSIS — I10 Essential (primary) hypertension: Secondary | ICD-10-CM

## 2019-08-03 DIAGNOSIS — M8949 Other hypertrophic osteoarthropathy, multiple sites: Secondary | ICD-10-CM

## 2019-08-03 DIAGNOSIS — K219 Gastro-esophageal reflux disease without esophagitis: Secondary | ICD-10-CM

## 2019-08-03 DIAGNOSIS — F411 Generalized anxiety disorder: Secondary | ICD-10-CM

## 2019-08-03 DIAGNOSIS — M159 Polyosteoarthritis, unspecified: Secondary | ICD-10-CM

## 2019-08-03 NOTE — Chronic Care Management (AMB) (Signed)
  Care Management Note   Kathleen Saunders is a 69 y.o. year old female who is a primary care patient of Junie Spencer, FNP. The CM team was consulted for assistance with chronic disease management and care coordination.   I reached out to [name] by phone today.   Review of patient status, including review of consultants reports, relevant laboratory and other test results, and collaboration with appropriate care team members and the patient's provider was performed as part of comprehensive patient evaluation and provision of chronic care management services.   Social determinants of health: risk of social isolation; risk of tobacco use; risk of stress; risk of physical inactivity    Office Visit from 11/06/2018 in [facility]  PHQ-9 Total Score  17     Medications   albuterol (PROVENTIL) (2.5 MG/3ML) 0.083% nebulizer solution albuterol (VENTOLIN HFA) 108 (90 Base) MCG/ACT inhaler colchicine 0.6 MG tablet ergocalciferol (VITAMIN D2) 1.25 MG (50000 UT) capsule esomeprazole (NEXIUM) 20 MG capsule furosemide (LASIX) 20 MG tablet guaiFENesin (MUCINEX) 600 MG 12 hr tablet hydrOXYzine (VISTARIL) 50 MG capsule lisinopril-hydrochlorothiazide (ZESTORETIC) 20-25 MG tablet methocarbamol (ROBAXIN) 500 MG tablet omeprazole (PRILOSEC) 40 MG capsule oxyCODONE-acetaminophen (PERCOCET) 7.5-325 MG tablet oxyCODONE-acetaminophen (PERCOCET) 7.5-325 MG tablet oxyCODONE-acetaminophen (PERCOCET) 7.5-325 MG tablet sertraline (ZOLOFT) 50 MG tablet  Goals Addressed            This Visit's Progress   . Client will talk with LCSW in next 30 days to discuss caregiver stress issues faced (pt-stated)         Barriers:  . Cargiver Strain/Stress . Pain issues of concern  Clinical Social Work Clinical Goal(s):  Marland Kitchen LCSW will call client in next 30 days to talk with client about caregiver stress issues  Interventions: . Talked with client about ADTS in home support services to  assist with care needs of her spouse . Talked with client about CCM program services . Talked with client about caregiver stress . Talked with client about ambulation needs of client (uses a cane or walker as needed) . Talked with client about her decreased energy . Talked with client about pain issues of client . Talked with client about her recent fall . Talked with client about care needs of her spouse  Patient Self Care Activities:  Takes medications as prescribed Attends scheduled medical appointments  Self Care Deficits . Caregiver stress issues . Pain issues faced  Initial goal documentation       Follow Up Plan: LCSW to call client in next 4 weeks to discuss with client stress issues faced over her current caregiving responsibilities   Kelton Pillar.Tharun Cappella MSW, LCSW Licensed Clinical Social Worker [facility] [phone]

## 2019-08-03 NOTE — Patient Instructions (Addendum)
Licensed Clinical Social Worker Visit Information  Goals we discussed today:  Goals Addressed            This Visit's Progress   . Client will talk with LCSW in next 30 days to discuss caregiver stress issues faced (pt-stated)          Barriers:  . Cargiver Strain/Stress . Pain issues of concern  Clinical Social Work Clinical Goal(s):  Marland Kitchen LCSW will call client in next 30 days to talk with client about caregiver stress issues  Interventions: . Talked with client about ADTS in home support services to assist with care needs of her spouse . Talked with client about CCM program services . Talked with client about caregiver stress . Talked with client about ambulation needs of client (uses a cane or walker as needed) . Talked with client about her decreased energy . Talked with client about pain issues of client . Talked with client about her recent fall . Talked with client about care needs of her spouse  Patient Self Care Activities:  Takes medications as prescribed Attends scheduled medical appointments  Self Care Deficits . Caregiver stress issues . Pain issues faced  Initial goal documentation        Materials Provided: No  Follow Up Plan: LCSW to call client in next 4 weeks to discuss with client stress issues faced over her current caregiving responsibilities   The patient verbalized understanding of instructions provided today and declined a print copy of patient instruction materials.   Kelton Pillar.Shavy Beachem MSW, LCSW Licensed Clinical Social Worker [facility] [phone]

## 2019-08-07 ENCOUNTER — Other Ambulatory Visit: Payer: Self-pay

## 2019-08-07 ENCOUNTER — Inpatient Hospital Stay (HOSPITAL_COMMUNITY): Payer: PPO

## 2019-08-07 ENCOUNTER — Inpatient Hospital Stay (HOSPITAL_COMMUNITY): Payer: PPO | Attending: Hematology

## 2019-08-07 DIAGNOSIS — D631 Anemia in chronic kidney disease: Secondary | ICD-10-CM | POA: Diagnosis not present

## 2019-08-07 DIAGNOSIS — N189 Chronic kidney disease, unspecified: Secondary | ICD-10-CM | POA: Diagnosis not present

## 2019-08-07 DIAGNOSIS — D649 Anemia, unspecified: Secondary | ICD-10-CM

## 2019-08-07 LAB — CBC WITH DIFFERENTIAL/PLATELET
Abs Immature Granulocytes: 0.05 10*3/uL (ref 0.00–0.07)
Basophils Absolute: 0.1 10*3/uL (ref 0.0–0.1)
Basophils Relative: 1 %
Eosinophils Absolute: 0.4 10*3/uL (ref 0.0–0.5)
Eosinophils Relative: 3 %
HCT: 38.8 % (ref 36.0–46.0)
Hemoglobin: 11.6 g/dL — ABNORMAL LOW (ref 12.0–15.0)
Immature Granulocytes: 0 %
Lymphocytes Relative: 21 %
Lymphs Abs: 2.4 10*3/uL (ref 0.7–4.0)
MCH: 27.8 pg (ref 26.0–34.0)
MCHC: 29.9 g/dL — ABNORMAL LOW (ref 30.0–36.0)
MCV: 92.8 fL (ref 80.0–100.0)
Monocytes Absolute: 0.8 10*3/uL (ref 0.1–1.0)
Monocytes Relative: 6 %
Neutro Abs: 8 10*3/uL — ABNORMAL HIGH (ref 1.7–7.7)
Neutrophils Relative %: 69 %
Platelets: 283 10*3/uL (ref 150–400)
RBC: 4.18 MIL/uL (ref 3.87–5.11)
RDW: 14.6 % (ref 11.5–15.5)
WBC: 11.7 10*3/uL — ABNORMAL HIGH (ref 4.0–10.5)
nRBC: 0 % (ref 0.0–0.2)

## 2019-08-07 LAB — IRON AND TIBC
Iron: 67 ug/dL (ref 28–170)
Saturation Ratios: 26 % (ref 10.4–31.8)
TIBC: 263 ug/dL (ref 250–450)
UIBC: 196 ug/dL

## 2019-08-07 LAB — FERRITIN: Ferritin: 267 ng/mL (ref 11–307)

## 2019-08-09 LAB — CALCITRIOL (1,25 DI-OH VIT D): Vit D, 1,25-Dihydroxy: 55.3 pg/mL (ref 19.9–79.3)

## 2019-08-13 ENCOUNTER — Other Ambulatory Visit: Payer: Self-pay | Admitting: Family

## 2019-08-14 ENCOUNTER — Ambulatory Visit (HOSPITAL_COMMUNITY): Payer: PPO | Admitting: Hematology

## 2019-08-29 ENCOUNTER — Telehealth: Payer: PPO

## 2019-08-30 ENCOUNTER — Telehealth: Payer: Self-pay

## 2019-09-06 DEATH — deceased

## 2020-12-18 IMAGING — DX CHEST - 2 VIEW
2 series · 2 of 2 positions shown · non-contrast
Comparison: October 20, 2009

CLINICAL DATA: Elevated white blood cell count

EXAM:
CHEST - 2 VIEW

[chest pa]
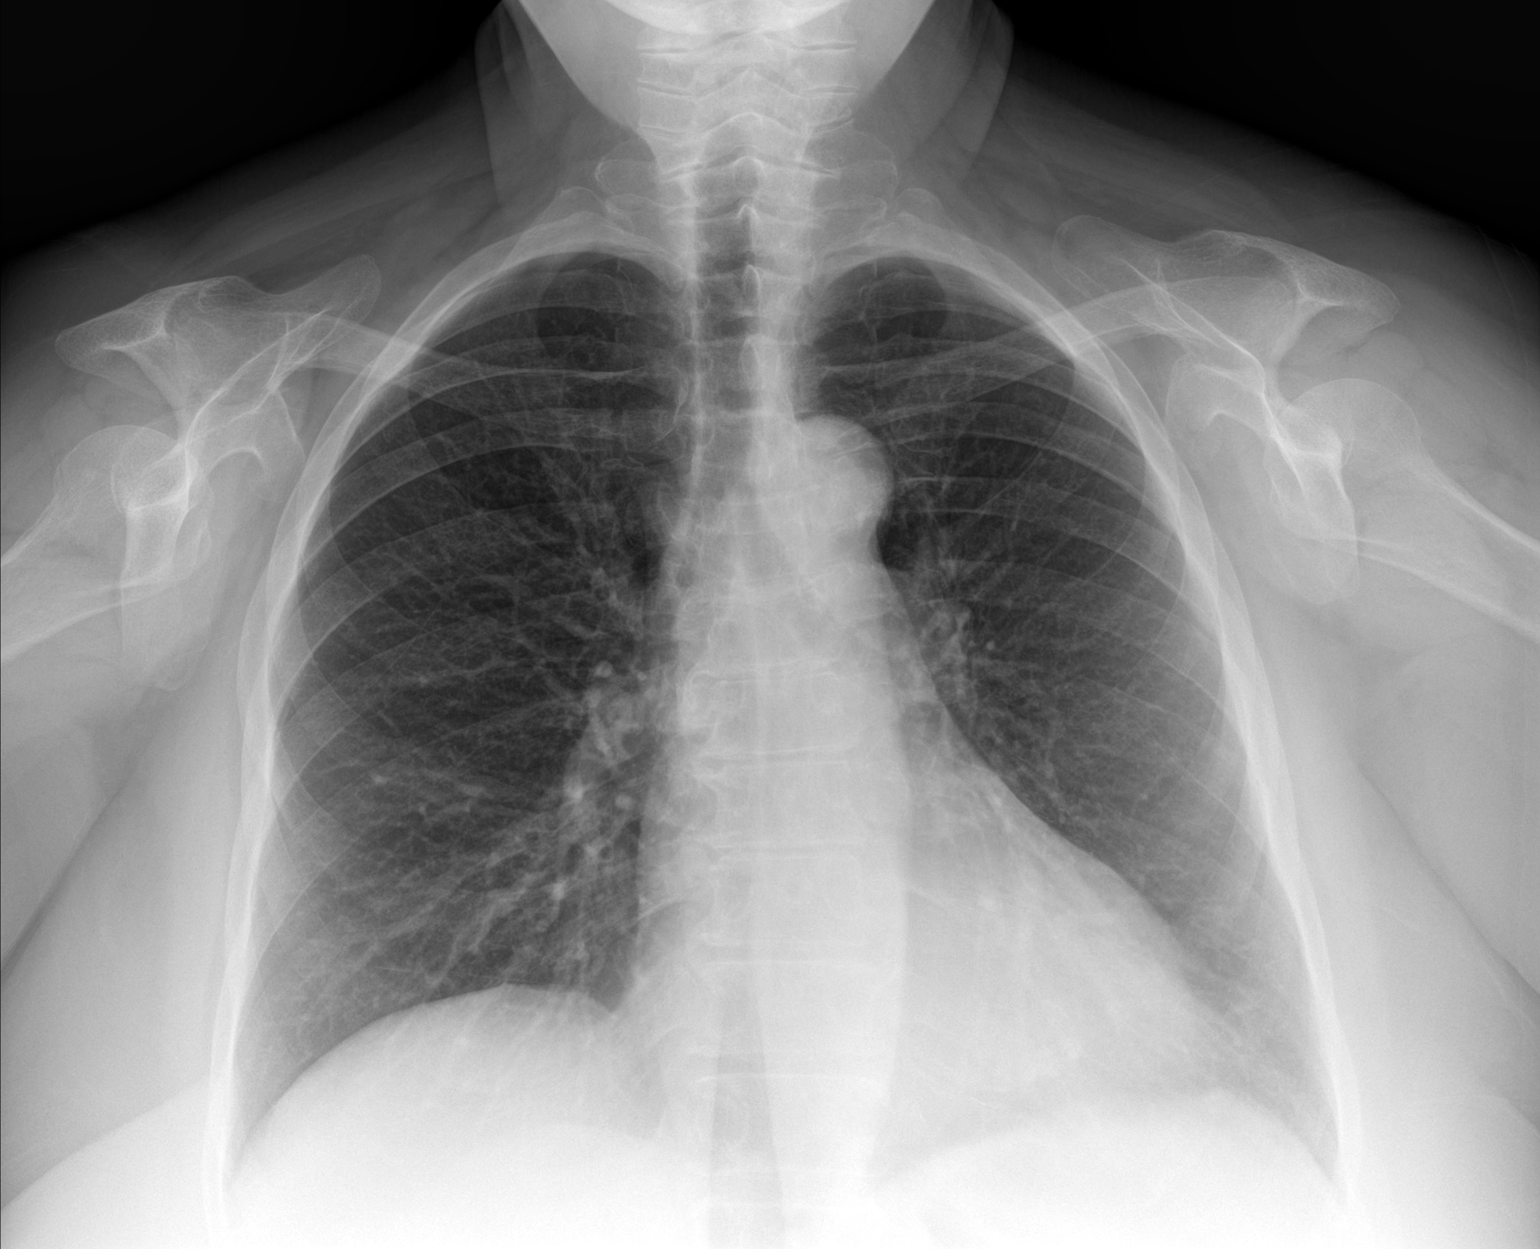

[chest lat]
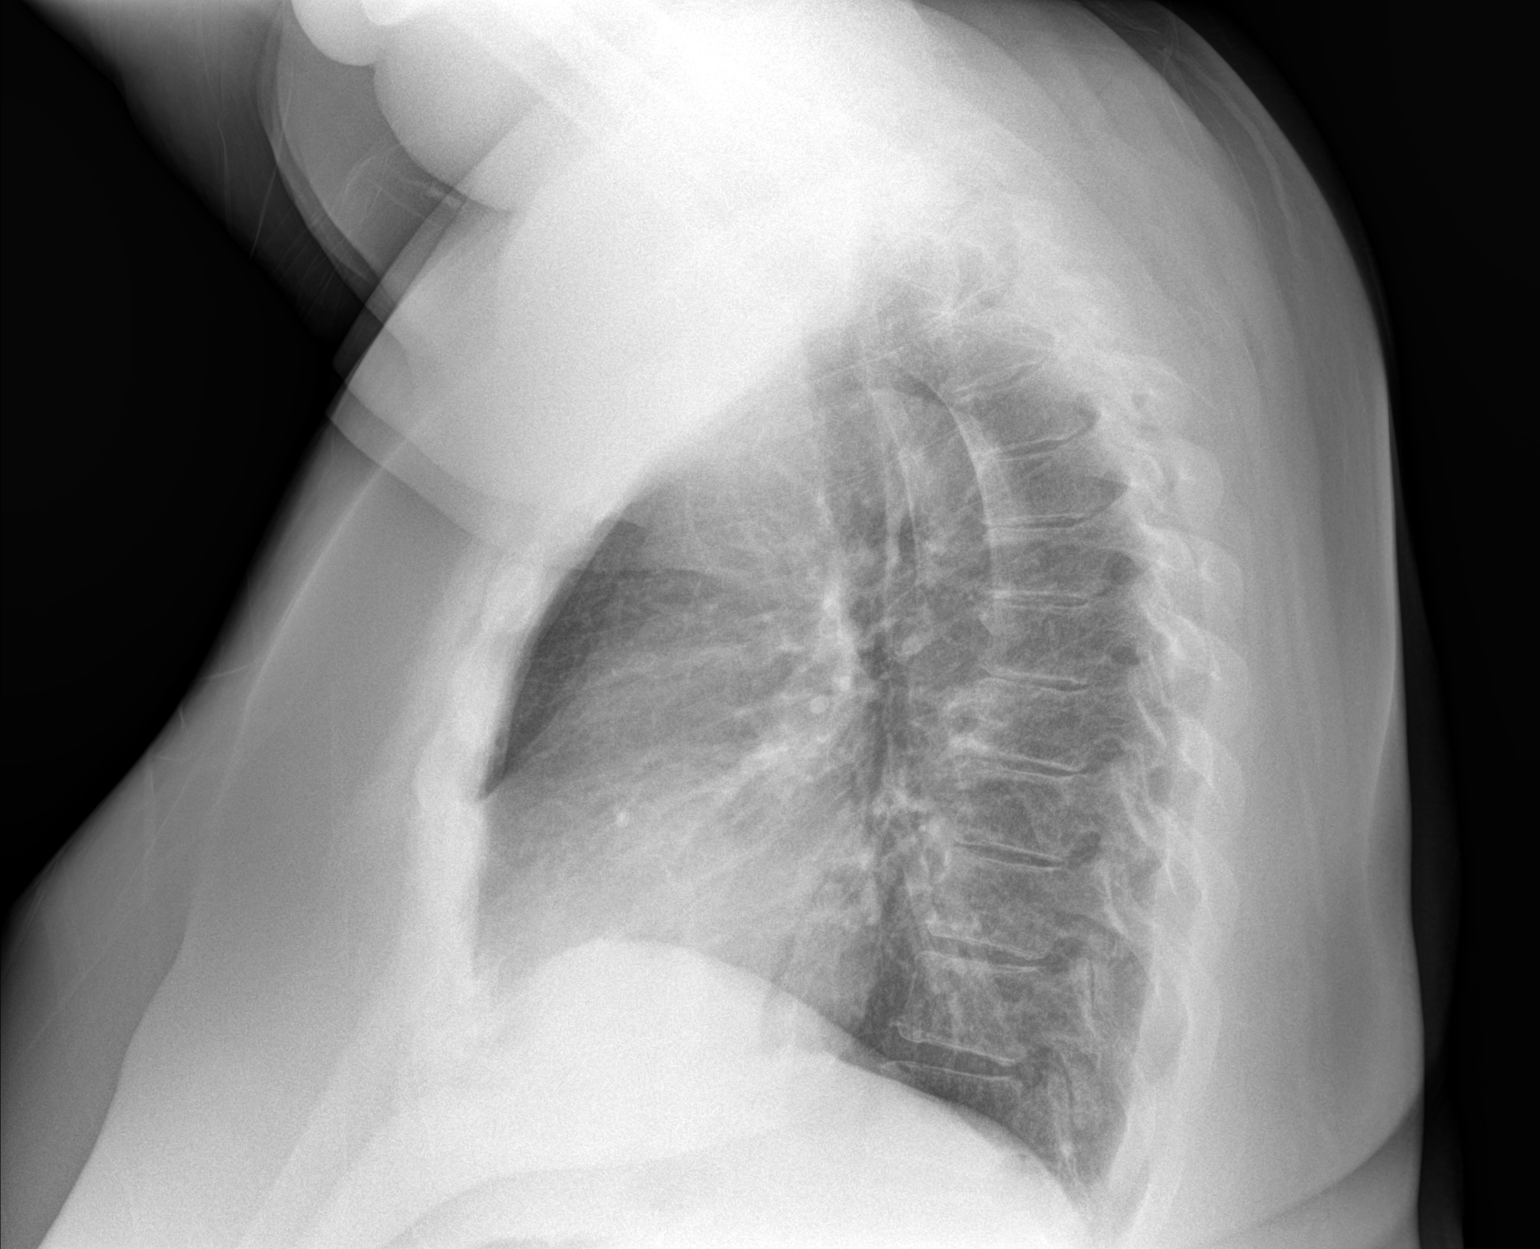

[2 of 2 positions shown; findings below may reference images not displayed]

FINDINGS: The heart size and mediastinal contours are within normal limits.
Both lungs are clear. The visualized skeletal structures are
unremarkable.
IMPRESSION: No active cardiopulmonary disease.
